# Patient Record
Sex: Female | Born: 1945 | Race: Black or African American | Hispanic: No | Marital: Single | Smoking: Current every day smoker
Health system: Southern US, Community
[De-identification: ages and names within clinical notes are randomized; demographics above are authoritative.]

## PROBLEM LIST (undated history)

## (undated) DIAGNOSIS — N200 Calculus of kidney: Secondary | ICD-10-CM

## (undated) DIAGNOSIS — I499 Cardiac arrhythmia, unspecified: Secondary | ICD-10-CM

## (undated) DIAGNOSIS — E785 Hyperlipidemia, unspecified: Secondary | ICD-10-CM

## (undated) DIAGNOSIS — I2510 Atherosclerotic heart disease of native coronary artery without angina pectoris: Secondary | ICD-10-CM

## (undated) DIAGNOSIS — R011 Cardiac murmur, unspecified: Secondary | ICD-10-CM

## (undated) DIAGNOSIS — M1990 Unspecified osteoarthritis, unspecified site: Secondary | ICD-10-CM

## (undated) DIAGNOSIS — J449 Chronic obstructive pulmonary disease, unspecified: Secondary | ICD-10-CM

## (undated) DIAGNOSIS — Z9289 Personal history of other medical treatment: Secondary | ICD-10-CM

## (undated) DIAGNOSIS — IMO0001 Reserved for inherently not codable concepts without codable children: Secondary | ICD-10-CM

## (undated) DIAGNOSIS — I10 Essential (primary) hypertension: Secondary | ICD-10-CM

## (undated) DIAGNOSIS — I219 Acute myocardial infarction, unspecified: Secondary | ICD-10-CM

## (undated) HISTORY — DX: Chronic obstructive pulmonary disease, unspecified: J44.9

## (undated) HISTORY — DX: Unspecified osteoarthritis, unspecified site: M19.90

## (undated) HISTORY — DX: Hyperlipidemia, unspecified: E78.5

## (undated) HISTORY — DX: Calculus of kidney: N20.0

## (undated) HISTORY — DX: Personal history of other medical treatment: Z92.89

## (undated) HISTORY — PX: BREAST BIOPSY: SHX20

## (undated) HISTORY — DX: Essential (primary) hypertension: I10

## (undated) HISTORY — DX: Atherosclerotic heart disease of native coronary artery without angina pectoris: I25.10

## (undated) HISTORY — DX: Cardiac arrhythmia, unspecified: I49.9

---

## 1999-05-11 ENCOUNTER — Emergency Department (HOSPITAL_COMMUNITY): Admission: EM | Admit: 1999-05-11 | Discharge: 1999-05-11 | Payer: Self-pay | Admitting: Emergency Medicine

## 1999-06-13 ENCOUNTER — Encounter: Payer: Self-pay | Admitting: Emergency Medicine

## 1999-06-13 ENCOUNTER — Emergency Department (HOSPITAL_COMMUNITY): Admission: EM | Admit: 1999-06-13 | Discharge: 1999-06-13 | Payer: Self-pay | Admitting: Emergency Medicine

## 2001-04-28 ENCOUNTER — Encounter: Payer: Self-pay | Admitting: Emergency Medicine

## 2001-04-28 ENCOUNTER — Inpatient Hospital Stay (HOSPITAL_COMMUNITY): Admission: EM | Admit: 2001-04-28 | Discharge: 2001-04-29 | Payer: Self-pay | Admitting: Emergency Medicine

## 2001-04-29 ENCOUNTER — Encounter: Payer: Self-pay | Admitting: Family Medicine

## 2001-05-25 ENCOUNTER — Encounter: Admission: RE | Admit: 2001-05-25 | Discharge: 2001-05-25 | Payer: Self-pay | Admitting: Family Medicine

## 2007-07-13 ENCOUNTER — Emergency Department (HOSPITAL_COMMUNITY): Admission: EM | Admit: 2007-07-13 | Discharge: 2007-07-13 | Payer: Self-pay | Admitting: Emergency Medicine

## 2010-04-21 ENCOUNTER — Emergency Department (HOSPITAL_COMMUNITY)
Admission: EM | Admit: 2010-04-21 | Discharge: 2010-04-21 | Payer: Self-pay | Source: Home / Self Care | Admitting: Emergency Medicine

## 2010-04-22 LAB — POCT I-STAT, CHEM 8
BUN: 14 mg/dL (ref 6–23)
Calcium, Ion: 1.16 mmol/L (ref 1.12–1.32)
Chloride: 107 mEq/L (ref 96–112)
Creatinine, Ser: 0.9 mg/dL (ref 0.4–1.2)
Glucose, Bld: 103 mg/dL — ABNORMAL HIGH (ref 70–99)
HCT: 49 % — ABNORMAL HIGH (ref 36.0–46.0)
Hemoglobin: 16.7 g/dL — ABNORMAL HIGH (ref 12.0–15.0)
Potassium: 4.1 mEq/L (ref 3.5–5.1)
Sodium: 142 mEq/L (ref 135–145)
TCO2: 26 mmol/L (ref 0–100)

## 2010-04-22 LAB — URINALYSIS, ROUTINE W REFLEX MICROSCOPIC
Bilirubin Urine: NEGATIVE
Hgb urine dipstick: NEGATIVE
Ketones, ur: NEGATIVE mg/dL
Nitrite: NEGATIVE
Protein, ur: NEGATIVE mg/dL
Specific Gravity, Urine: 1.025 (ref 1.005–1.030)
Urine Glucose, Fasting: NEGATIVE mg/dL
Urobilinogen, UA: 1 mg/dL (ref 0.0–1.0)
pH: 6 (ref 5.0–8.0)

## 2010-05-14 ENCOUNTER — Other Ambulatory Visit (HOSPITAL_COMMUNITY): Payer: Self-pay | Admitting: Internal Medicine

## 2010-05-14 DIAGNOSIS — Z1231 Encounter for screening mammogram for malignant neoplasm of breast: Secondary | ICD-10-CM

## 2010-05-26 ENCOUNTER — Ambulatory Visit (HOSPITAL_COMMUNITY): Payer: Medicaid Other | Attending: Internal Medicine
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
## 2011-02-10 ENCOUNTER — Other Ambulatory Visit: Payer: Self-pay

## 2011-02-10 DIAGNOSIS — I739 Peripheral vascular disease, unspecified: Secondary | ICD-10-CM

## 2011-02-19 ENCOUNTER — Encounter: Payer: Self-pay | Admitting: Gastroenterology

## 2011-02-19 ENCOUNTER — Encounter: Payer: Self-pay | Admitting: Surgery

## 2011-03-08 ENCOUNTER — Other Ambulatory Visit (HOSPITAL_COMMUNITY): Payer: Self-pay | Admitting: Nurse Practitioner

## 2011-03-08 ENCOUNTER — Ambulatory Visit (HOSPITAL_COMMUNITY)
Admission: RE | Admit: 2011-03-08 | Discharge: 2011-03-08 | Disposition: A | Discharge status: Home / Self Care | Payer: Medicaid Other | Source: Ambulatory Visit | Attending: Nurse Practitioner | Admitting: Nurse Practitioner

## 2011-03-08 DIAGNOSIS — R0602 Shortness of breath: Secondary | ICD-10-CM | POA: Insufficient documentation

## 2011-03-08 DIAGNOSIS — Z01818 Encounter for other preprocedural examination: Secondary | ICD-10-CM

## 2011-03-08 DIAGNOSIS — I10 Essential (primary) hypertension: Secondary | ICD-10-CM | POA: Insufficient documentation

## 2011-03-08 DIAGNOSIS — R079 Chest pain, unspecified: Secondary | ICD-10-CM | POA: Insufficient documentation

## 2011-03-10 ENCOUNTER — Encounter: Payer: Self-pay | Admitting: Gastroenterology

## 2011-03-10 ENCOUNTER — Ambulatory Visit (INDEPENDENT_AMBULATORY_CARE_PROVIDER_SITE_OTHER): Payer: Medicaid Other | Admitting: Gastroenterology

## 2011-03-10 VITALS — BP 150/82 | HR 108 | Ht 63.5 in | Wt 150.8 lb

## 2011-03-10 DIAGNOSIS — R195 Other fecal abnormalities: Secondary | ICD-10-CM

## 2011-03-10 DIAGNOSIS — I10 Essential (primary) hypertension: Secondary | ICD-10-CM

## 2011-03-10 DIAGNOSIS — I119 Hypertensive heart disease without heart failure: Secondary | ICD-10-CM | POA: Insufficient documentation

## 2011-03-10 MED ORDER — PEG-KCL-NACL-NASULF-NA ASC-C 100 G PO SOLR: 1.0000 | ORAL | Status: DC

## 2011-03-10 NOTE — Progress Notes (Signed)
HPI: This is a   very pleasant 65 year old woman who I am meeting for the first time.  She had fobt home testing, and was positive. She never sees blood in her stool.  No CRC in family.  No GI symptoms except mild, intemrittent consitpation.  Overall her weight is down, intentionally.  She has lost 40 pounds in 2 years.  Never had a colonoscopy.  We do not have tests from her PCP available at time of this visit (had blood tests done).  She stopped NSAIDs.  Was on alleve (pretty frequently up to about 6 months ago).    Review of systems: Pertinent positive and negative review of systems were noted in the above HPI section. Complete review of systems was performed and was otherwise normal.    Past Medical History  Diagnosis Date  . Arthritis   . Knee pain   . Hip pain   . Hypertension   . Kidney stones     Past Surgical History  Procedure Date  . Breast biopsy     Current Outpatient Prescriptions  Medication Sig Dispense Refill  . Acetaminophen (TYLENOL PO) Take by mouth.        [name] amLODipine (NORVASC) 10 MG tablet Take 10 mg by mouth daily.        . cloNIDine (CATAPRES) 0.1 MG tablet Take 0.1 mg by mouth 2 (two) times daily.        . Ibuprofen (ADVIL PO) Take by mouth.        . lovastatin (MEVACOR) 20 MG tablet Take 20 mg by mouth daily.        . Naproxen Sodium (ALEVE PO) Take by mouth.        . TRAMADOL HCL, BIPHASIC, PO Take by mouth.          Allergies as of 03/10/2011  . (No Known Allergies)    Family History  Problem Relation Age of Onset  . Diabetes Father   . Hypertension Father   . Breast cancer Mother     History   Social History  . Marital Status: Single    Spouse Name: N/A    Number of Children: 0  . Years of Education: N/A   Occupational History  . unemployed    Social History Main Topics  . Smoking status: Current Everyday Smoker -- 0.5 packs/day    Types: Cigarettes  . Smokeless tobacco: Not on file  . Alcohol Use: No  . Drug Use: Not  on file  . Sexually Active: Not on file   Other Topics Concern  . Not on file   Social History Narrative   2 cups of coffee daily       Physical Exam: BP 150/82  Pulse 108  Ht 5' 3.5" (1.613 m)  Wt 150 lb 12.8 oz (68.402 kg)  BMI 26.29 kg/m2  SpO2 96% Constitutional: generally well-appearing Psychiatric: alert and oriented x3 Eyes: extraocular movements intact Mouth: oral pharynx moist, no lesions Neck: supple no lymphadenopathy Cardiovascular: heart regular rate and rhythm Lungs: clear to auscultation bilaterally Abdomen: soft, nontender, nondistended, no obvious ascites, no peritoneal signs, normal bowel sounds Extremities: no lower extremity edema bilaterally Skin: no lesions on visible extremities    Assessment and plan: 65 y.o. female with  Hemoccult-positive stool  She has no overt GI bleeding and no specific GI symptoms. She has never had colon cancer screening with colonoscopy. We will proceed with colonoscopy at her soonest convenience. We will also work to get her stool  testing and blood work from her primary care office.  The risks, benefits, alternatives to the procedure were discussed with her and she agreed to proceed.

## 2011-03-10 NOTE — Patient Instructions (Addendum)
You have been given a separate informational sheet regarding your tobacco use, the importance of quitting and local resources to help you quit. You will be set up for a colonoscopy at [facility]. A copy of this information will be made available to Dr. Ruben Im.

## 2011-04-05 ENCOUNTER — Other Ambulatory Visit: Payer: Medicaid Other

## 2011-04-05 ENCOUNTER — Encounter: Payer: Medicaid Other | Admitting: Surgery

## 2011-04-07 ENCOUNTER — Telehealth: Payer: Self-pay | Admitting: Gastroenterology

## 2011-04-08 NOTE — Telephone Encounter (Signed)
Pt had questions regarding an appt with the vascular lab on 05/03/11 and her colon on 04/12/11.  She wanted to know if the vascular lab appt would interfere.  I advised her that the appt would not interfere.

## 2011-04-12 ENCOUNTER — Ambulatory Visit (AMBULATORY_SURGERY_CENTER): Payer: Medicaid Other | Admitting: Gastroenterology

## 2011-04-12 ENCOUNTER — Encounter: Payer: Self-pay | Admitting: Gastroenterology

## 2011-04-12 VITALS — BP 173/81 | HR 98 | Temp 98.6°F | Resp 17

## 2011-04-12 DIAGNOSIS — D126 Benign neoplasm of colon, unspecified: Secondary | ICD-10-CM

## 2011-04-12 DIAGNOSIS — K5730 Diverticulosis of large intestine without perforation or abscess without bleeding: Secondary | ICD-10-CM

## 2011-04-12 DIAGNOSIS — R195 Other fecal abnormalities: Secondary | ICD-10-CM

## 2011-04-12 MED ORDER — SODIUM CHLORIDE 0.9 % IV SOLN: 500.0000 mL | INTRAVENOUS | Status: DC

## 2011-04-12 NOTE — Patient Instructions (Signed)
Please read the handouts given to you by your recovery room nurse.    You will need to come back in 6 mos for another colonoscopy to be sure that all of the large polyp was removed.   Please, resume all of you routine medications today.  Your biopsy results will be sent to your home within 2 weeks.   If you have any questions or concerns, please call [phone]. Thank-you.
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
## 2011-04-12 NOTE — Op Note (Signed)
[facility] [address] [place], [place]  [address]  COLONOSCOPY PROCEDURE REPORT  PATIENT:  Brandy Copeland, Brandy Copeland  MR#:  [id number] BIRTHDATE:  May 10, 1945, 65 yrs. old  GENDER:  female ENDOSCOPIST:  Rachael Fee, MD REFERING:   Dr. Ruben Im PROCEDURE DATE:  04/12/2011 PROCEDURE:  Colonoscopy with snare polypectomy ASA CLASS:  Class II INDICATIONS:  FOBT positive stool MEDICATIONS:   Fentanyl 100 mcg IV, These medications were titrated to patient response per physician's verbal order, Versed 10 mg IV  DESCRIPTION OF PROCEDURE:   After the risks benefits and alternatives of the procedure were thoroughly explained, informed consent was obtained.  Digital rectal exam was performed and revealed no rectal masses.   The LB [id number]-[id number] endoscope was introduced through the anus and advanced to the cecum, which was identified by both the appendix and ileocecal valve, without limitations.  The quality of the prep was good..  The instrument was then slowly withdrawn as the colon was fully examined. <<PROCEDUREIMAGES>> FINDINGS:  Three polyps were found. One was 1.6cm across, villous, sessile, located in cecum. This was removed piecemeal with cold snare and snare cautery and sent to pathology (jar 1). The other two polyps were sessile, 2-58mm across, located in transverse and descending segments. These were removed with cold snare and sent to pathology (jar 2) (see image1, image5, image6, and image8). Mild diverticulosis was found in the sigmoid to descending colon segments (see image9).  This was otherwise a normal examination of the colon (see image10, image3, and image4).   Retroflexed views in the rectum revealed no abnormalities. COMPLICATIONS:  None ENDOSCOPIC IMPRESSION: 1) Three polyps; all were removed and all were sent to pathology. One was >1cm and removed in piecemeal fashion. 2) Mild diverticulosis in the sigmoid to descending colon segments 3) Otherwise  normal examination  RECOMMENDATIONS: 1) You will receive a letter within 1-2 weeks with the results of your biopsy as well as final recommendations. Please call my office if you have not received a letter after 3 weeks.  You will likely need a repeat colonoscopy in 6 months given the piecemeal resection.  ______________________________ Rachael Fee, MD  n. eSIGNED:   Rachael Fee at 04/12/2011 09:19 AM  Jesus Genera, [phone]

## 2011-04-13 ENCOUNTER — Telehealth: Payer: Self-pay | Admitting: *Deleted

## 2011-04-13 NOTE — Telephone Encounter (Signed)

## 2011-04-19 ENCOUNTER — Encounter: Payer: Self-pay | Admitting: Gastroenterology

## 2011-04-20 ENCOUNTER — Encounter: Payer: Self-pay | Admitting: *Deleted

## 2011-04-30 ENCOUNTER — Encounter: Payer: Self-pay | Admitting: Surgery

## 2011-05-03 ENCOUNTER — Encounter: Payer: Medicaid Other | Admitting: Surgery

## 2011-05-03 ENCOUNTER — Other Ambulatory Visit: Payer: Medicaid Other

## 2011-05-28 ENCOUNTER — Encounter: Payer: Self-pay | Admitting: Surgery

## 2011-05-31 ENCOUNTER — Encounter: Payer: Self-pay | Admitting: Surgery

## 2011-05-31 ENCOUNTER — Encounter (INDEPENDENT_AMBULATORY_CARE_PROVIDER_SITE_OTHER): Payer: Medicare Other | Admitting: *Deleted

## 2011-05-31 ENCOUNTER — Ambulatory Visit (INDEPENDENT_AMBULATORY_CARE_PROVIDER_SITE_OTHER): Payer: Medicare Other | Admitting: Surgery

## 2011-05-31 VITALS — BP 153/83 | HR 93 | Resp 16 | Ht 63.0 in | Wt 149.0 lb

## 2011-05-31 DIAGNOSIS — M79609 Pain in unspecified limb: Secondary | ICD-10-CM | POA: Insufficient documentation

## 2011-05-31 DIAGNOSIS — I739 Peripheral vascular disease, unspecified: Secondary | ICD-10-CM

## 2011-05-31 NOTE — Progress Notes (Signed)
[facility]   Patient name: Brandy Copeland MRN: [id number] DOB: 1945/06/17 Sex: female   Referred by: Dr. Cleophas Dunker  Reason for referral:  Chief Complaint  Patient presents with  . New Evaluation    Consult for right knee replacement-     HISTORY OF PRESENT ILLNESS: The patient comes in today for a vascular evaluation with an impending knee replacement surgery on the right. The patient complains of right knee and hip pain as well as left hip pain. She states is bothers her when she stands up and when she sits down. She does not endorse claudication like symptoms that occur with activity. I am asked to evaluate her preoperatively for knee replacement. The patient has a history of hypertension as well as an irregular heartbeat. She is not on anticoagulation.  Past Medical History  Diagnosis Date  . Arthritis   . Knee pain   . Hip pain   . Hypertension   . Kidney stones   . Irregular heart beat     Past Surgical History  Procedure Date  . Breast biopsy     History   Social History  . Marital Status: Single    Spouse Name: N/A    Number of Children: 0  . Years of Education: N/A   Occupational History  . unemployed    Social History Main Topics  . Smoking status: Current Everyday Smoker -- 0.5 packs/day    Types: Cigarettes  . Smokeless tobacco: Not on file  . Alcohol Use: No  . Drug Use: Not on file  . Sexually Active: Not on file   Other Topics Concern  . Not on file   Social History Narrative   2 cups of coffee daily    Family History  Problem Relation Age of Onset  . Diabetes Father     Amputation  . Hypertension Father   . Hyperlipidemia Father   . Heart disease Father     Heart Disease before age 72  . Breast cancer Mother   . Cancer Mother     Breast     Allergies as of 05/31/2011  . (No Known Allergies)    Current Outpatient Prescriptions on File Prior to Visit  Medication Sig Dispense Refill  . lovastatin  (MEVACOR) 20 MG tablet Take 20 mg by mouth daily.        . TRAMADOL HCL, BIPHASIC, PO Take by mouth.        [name] amLODipine (NORVASC) 10 MG tablet Take 10 mg by mouth daily.        . cloNIDine (CATAPRES) 0.1 MG tablet Take 0.1 mg by mouth 2 (two) times daily.           REVIEW OF SYSTEMS: All negative except what is mentioned in the history of present illness PHYSICAL EXAMINATION: General: The patient appears their stated age.  Vital signs are BP 153/83  Pulse 93  Resp 16  Ht 5\' 3"  (1.6 m)  Wt 149 lb (67.586 kg)  BMI 26.39 kg/m2  SpO2 97% HEENT:  No gross abnormalities Pulmonary: Respirations are non-labored Abdomen: Soft and non-tender , obese Musculoskeletal: There are no major deformities.   Neurologic: No focal weakness or paresthesias are detected, Skin: There are no ulcer or rashes noted. Psychiatric: The patient has normal affect. Cardiovascular: There is a regular rate and rhythm without significant murmur appreciated. Pedal pulses are nonpalpable. She has palpable femoral pulses.  Diagnostic Studies: Duplex ultrasound was performed today of both lower  extremities this shows the ABI 0.59 on the right and 0.57 on the left. 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    Assessment:  Bilateral lower extremity arterial insufficiency Plan: The patient is not symptomatic from her arterial insufficiency. I would recommend proceeding with her orthopedic procedures. If after her surgery has been completed, she still has pain or cramping in her calves with exercise I would consider further evaluation. I told the patient that she should contact me should the symptoms arise. Otherwise I will see her on a when necessary basis.     Jorge Ny, M.D. [facility] [place] Office: [phone] Pager:  [phone]

## 2011-06-14 NOTE — Procedures (Unsigned)
LOWER EXTREMITY ARTERIAL EVALUATION-SINGLE LEVEL  INDICATION:  Preop right knee replacement/claudication  HISTORY: Diabetes:  No Cardiac:  No Hypertension:  Yes Smoking:  Yes Previous Surgery:  RESTING SYSTOLIC PRESSURES: (ABI)                         RIGHT                LEFT Brachial: Anterior tibial: Posterior tibial: Peroneal: DOPPLER WAVEFORM ANALYSIS: Anterior tibial: Posterior tibial: Peroneal:  PREVIOUS ABI'S:  Date:  RIGHT:  LEFT:  DUPLEX:  Right:  Mixed plaque throughout with biphasic waveforms proximally to the distal SFA with increased velocity of 208 in the popliteal artery.  Damped monophasic waveform from the distal SFA on distally. Left:  No color flow or Doppler signal in the mid SFA with reconstitution on distally.  IMPRESSION: 1. 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 2. Occluded left mid superficial femoral artery with reconstitution at     distal superficial femoral artery. 3. Moderate reduction of ankle brachial indices bilaterally.  ___________________________________________ V. Charlena Cross, MD  SS/MEDQ  D:  05/31/2011  T:  05/31/2011  Job:  [id number]

## 2011-09-29 ENCOUNTER — Encounter: Payer: Self-pay | Admitting: Gastroenterology

## 2011-09-30 ENCOUNTER — Other Ambulatory Visit: Payer: Self-pay | Admitting: Internal Medicine

## 2011-09-30 DIAGNOSIS — Z1231 Encounter for screening mammogram for malignant neoplasm of breast: Secondary | ICD-10-CM

## 2011-10-08 ENCOUNTER — Ambulatory Visit: Payer: Medicare Other

## 2012-07-13 ENCOUNTER — Encounter: Payer: Self-pay | Admitting: Gastroenterology

## 2012-12-29 ENCOUNTER — Encounter (HOSPITAL_COMMUNITY): Payer: Self-pay | Admitting: Emergency Medicine

## 2012-12-29 ENCOUNTER — Emergency Department (HOSPITAL_COMMUNITY): Payer: Medicare Other

## 2012-12-29 ENCOUNTER — Inpatient Hospital Stay (HOSPITAL_COMMUNITY)
Admission: EM | Admit: 2012-12-29 | Discharge: 2013-01-04 | DRG: 190 | Disposition: A | Discharge status: Home / Self Care | Payer: Medicare Other | Attending: Family Medicine | Admitting: Family Medicine

## 2012-12-29 DIAGNOSIS — I2510 Atherosclerotic heart disease of native coronary artery without angina pectoris: Secondary | ICD-10-CM | POA: Diagnosis present

## 2012-12-29 DIAGNOSIS — I498 Other specified cardiac arrhythmias: Secondary | ICD-10-CM | POA: Diagnosis present

## 2012-12-29 DIAGNOSIS — E785 Hyperlipidemia, unspecified: Secondary | ICD-10-CM | POA: Diagnosis present

## 2012-12-29 DIAGNOSIS — J9600 Acute respiratory failure, unspecified whether with hypoxia or hypercapnia: Secondary | ICD-10-CM | POA: Diagnosis present

## 2012-12-29 DIAGNOSIS — I2489 Other forms of acute ischemic heart disease: Secondary | ICD-10-CM | POA: Diagnosis present

## 2012-12-29 DIAGNOSIS — I119 Hypertensive heart disease without heart failure: Secondary | ICD-10-CM | POA: Diagnosis present

## 2012-12-29 DIAGNOSIS — Z23 Encounter for immunization: Secondary | ICD-10-CM

## 2012-12-29 DIAGNOSIS — F17200 Nicotine dependence, unspecified, uncomplicated: Secondary | ICD-10-CM | POA: Diagnosis present

## 2012-12-29 DIAGNOSIS — I10 Essential (primary) hypertension: Secondary | ICD-10-CM

## 2012-12-29 DIAGNOSIS — M79609 Pain in unspecified limb: Secondary | ICD-10-CM

## 2012-12-29 DIAGNOSIS — I248 Other forms of acute ischemic heart disease: Secondary | ICD-10-CM | POA: Diagnosis present

## 2012-12-29 DIAGNOSIS — Z79899 Other long term (current) drug therapy: Secondary | ICD-10-CM

## 2012-12-29 DIAGNOSIS — R7309 Other abnormal glucose: Secondary | ICD-10-CM | POA: Diagnosis present

## 2012-12-29 DIAGNOSIS — R000 Tachycardia, unspecified: Secondary | ICD-10-CM

## 2012-12-29 DIAGNOSIS — I214 Non-ST elevation (NSTEMI) myocardial infarction: Secondary | ICD-10-CM | POA: Diagnosis present

## 2012-12-29 DIAGNOSIS — J441 Chronic obstructive pulmonary disease with (acute) exacerbation: Principal | ICD-10-CM | POA: Diagnosis present

## 2012-12-29 LAB — POCT I-STAT 3, ART BLOOD GAS (G3+)
O2 Saturation: 98 %
Patient temperature: 98.6
TCO2: 25 mmol/L (ref 0–100)
pCO2 arterial: 47 mmHg — ABNORMAL HIGH (ref 35.0–45.0)
pH, Arterial: 7.312 — ABNORMAL LOW (ref 7.350–7.450)
pO2, Arterial: 111 mmHg — ABNORMAL HIGH (ref 80.0–100.0)

## 2012-12-29 LAB — POCT I-STAT TROPONIN I: Troponin i, poc: 0.13 ng/mL (ref 0.00–0.08)

## 2012-12-29 LAB — BASIC METABOLIC PANEL
CO2: 22 mEq/L (ref 19–32)
Calcium: 9.2 mg/dL (ref 8.4–10.5)
Creatinine, Ser: 0.62 mg/dL (ref 0.50–1.10)
Glucose, Bld: 186 mg/dL — ABNORMAL HIGH (ref 70–99)

## 2012-12-29 LAB — CBC WITH DIFFERENTIAL/PLATELET
Eosinophils Relative: 3 % (ref 0–5)
HCT: 46.2 % — ABNORMAL HIGH (ref 36.0–46.0)
Lymphocytes Relative: 18 % (ref 12–46)
Lymphs Abs: 2.2 10*3/uL (ref 0.7–4.0)
MCH: 33.7 pg (ref 26.0–34.0)
MCHC: 35.3 g/dL (ref 30.0–36.0)
MCV: 95.5 fL (ref 78.0–100.0)
Monocytes Absolute: 0.6 10*3/uL (ref 0.1–1.0)
Platelets: 386 10*3/uL (ref 150–400)
RBC: 4.84 MIL/uL (ref 3.87–5.11)
WBC: 12.3 10*3/uL — ABNORMAL HIGH (ref 4.0–10.5)

## 2012-12-29 LAB — PRO B NATRIURETIC PEPTIDE: Pro B Natriuretic peptide (BNP): 441.9 pg/mL — ABNORMAL HIGH (ref 0–125)

## 2012-12-29 MED ORDER — NITROGLYCERIN 0.4 MG SL SUBL: 0.4000 mg | SUBLINGUAL_TABLET | SUBLINGUAL | Status: DC | PRN

## 2012-12-29 MED ORDER — ALBUTEROL (5 MG/ML) CONTINUOUS INHALATION SOLN
INHALATION_SOLUTION | RESPIRATORY_TRACT | Status: AC
  Administered 2012-12-29: 15 mg/h via RESPIRATORY_TRACT
  Filled 2012-12-29: qty 20

## 2012-12-29 MED ORDER — ALBUTEROL (5 MG/ML) CONTINUOUS INHALATION SOLN
15.0000 mg/h | INHALATION_SOLUTION | Freq: Once | RESPIRATORY_TRACT | Status: AC
  Administered 2012-12-29: 15 mg/h via RESPIRATORY_TRACT

## 2012-12-29 MED ORDER — MORPHINE SULFATE 4 MG/ML IJ SOLN
4.0000 mg | Freq: Once | INTRAMUSCULAR | Status: AC
  Administered 2012-12-29: 4 mg via INTRAVENOUS
  Filled 2012-12-29: qty 1

## 2012-12-29 MED ORDER — NITROGLYCERIN 2 % TD OINT
1.0000 [in_us] | TOPICAL_OINTMENT | Freq: Four times a day (QID) | TRANSDERMAL | Status: DC
  Administered 2012-12-29: 1 [in_us] via TOPICAL
  Filled 2012-12-29: qty 1

## 2012-12-29 MED ORDER — LEVOFLOXACIN IN D5W 500 MG/100ML IV SOLN
500.0000 mg | INTRAVENOUS | Status: DC
  Administered 2012-12-29: 500 mg via INTRAVENOUS
  Filled 2012-12-29 (×2): qty 100

## 2012-12-29 MED ORDER — ASPIRIN EC 325 MG PO TBEC
325.0000 mg | DELAYED_RELEASE_TABLET | Freq: Every day | ORAL | Status: DC
  Administered 2012-12-29 – 2013-01-04 (×6): 325 mg via ORAL
  Filled 2012-12-29 (×6): qty 1

## 2012-12-29 NOTE — ED Notes (Signed)
Pt to ED via [facility] for evaluation of difficulty breathing.  EMS found pt in tripod position SpO2 < 90% upon arrival, wheezing noted throughout.  EMS administered 125mg  Solumedrol, 1mg  Atrovent, 10mg  Albuterol.  Upon arrival to ED pt states that her chest feels tight, tachycardic on the monitor- 142bpm.  Respiratory and MD at bedside.

## 2012-12-29 NOTE — ED Provider Notes (Signed)
CSN: [id number]     Arrival date & time 12/29/12  1819 History   First MD Initiated Contact with Patient 12/29/12 1827     Chief Complaint  Patient presents with  . Respiratory Distress   (Consider location/radiation/quality/duration/timing/severity/associated sxs/prior Treatment) Patient is a 67 y.o. female presenting with shortness of breath. The history is provided by the patient and the EMS personnel. No language interpreter was used.  Shortness of Breath Severity:  Severe Onset quality:  Gradual Duration:  2 days Timing:  Constant Progression:  Worsening Chronicity:  Recurrent Context: URI   Relieved by:  Oxygen Worsened by:  Exertion (lying down) Ineffective treatments:  Inhaler Associated symptoms: cough, diaphoresis, sputum production and wheezing   Associated symptoms: no abdominal pain, no fever, no hemoptysis and no vomiting   Cough:    Cough characteristics:  Non-productive Risk factors: tobacco use   Risk factors: no hx of cancer, no hx of PE/DVT, no prolonged immobilization and no recent surgery     Past Medical History  Diagnosis Date  . Arthritis   . Knee pain   . Hip pain   . Hypertension   . Kidney stones   . Irregular heart beat    Past Surgical History  Procedure Laterality Date  . Breast biopsy     Family History  Problem Relation Age of Onset  . Diabetes Father     Amputation  . Hypertension Father   . Hyperlipidemia Father   . Heart disease Father     Heart Disease before age 24  . Breast cancer Mother   . Cancer Mother     Breast    History  Substance Use Topics  . Smoking status: Current Every Day Smoker -- 0.50 packs/day    Types: Cigarettes  . Smokeless tobacco: Not on file  . Alcohol Use: No   OB History   Grav Para Term Preterm Abortions TAB SAB Ect Mult Living                 Review of Systems  Constitutional: Positive for diaphoresis. Negative for fever.  Respiratory: Positive for cough, sputum production, chest  tightness, shortness of breath and wheezing. Negative for hemoptysis.   Cardiovascular: Negative for palpitations and leg swelling.  Gastrointestinal: Negative for nausea, vomiting and abdominal pain.  Musculoskeletal: Negative for back pain.  All other systems reviewed and are negative.    Allergies  Review of patient's allergies indicates no known allergies.  Home Medications   Current Outpatient Rx  Name  Route  Sig  Dispense  Refill  . amLODipine (NORVASC) 10 MG tablet   Oral   Take 10 mg by mouth daily.           . cloNIDine (CATAPRES) 0.1 MG tablet   Oral   Take 0.1 mg by mouth 2 (two) times daily.           [name] lovastatin (MEVACOR) 20 MG tablet   Oral   Take 20 mg by mouth daily.           . Olmesartan-Amlodipine-HCTZ (TRIBENZOR) 40-5-12.5 MG TABS   Oral   Take by mouth.         . TRAMADOL HCL, BIPHASIC, PO   Oral   Take by mouth.            BP 210/114  Pulse 142  Temp(Src) 100.3 F (37.9 C) (Rectal)  Resp 22  SpO2 98% Physical Exam  Nursing note and vitals reviewed. Constitutional: She is  oriented to person, place, and time. She appears well-developed and well-nourished. She appears distressed.  HENT:  Head: Normocephalic.  Eyes: Right eye exhibits no discharge. Left eye exhibits no discharge.  Neck: Neck supple. No JVD present. No tracheal deviation present.  Cardiovascular: Regular rhythm, normal heart sounds and intact distal pulses.  Tachycardia present.   Pulmonary/Chest: No stridor. Tachypnea noted. She is in respiratory distress. She has wheezes (diffuse). She has rales.  Abdominal: Soft. Bowel sounds are normal. She exhibits no distension. There is no tenderness.  Musculoskeletal: She exhibits no edema and no tenderness.  Neurological: She is alert and oriented to person, place, and time.  Skin: Skin is warm. She is diaphoretic.    ED Course  Procedures (including critical care time) Labs Review Labs Reviewed  CBC WITH DIFFERENTIAL  - Abnormal; Notable for the following:    WBC 12.3 (*)    Hemoglobin 16.3 (*)    HCT 46.2 (*)    Neutro Abs 9.1 (*)    All other components within normal limits  BASIC METABOLIC PANEL  PRO B NATRIURETIC PEPTIDE  URINALYSIS, ROUTINE W REFLEX MICROSCOPIC   Imaging Review Dg Chest Portable 1 View  12/29/2012   CLINICAL DATA:  Respiratory distress  EXAM: PORTABLE CHEST - 1 VIEW  COMPARISON:  [id number]  FINDINGS: The heart size and mediastinal contours are within normal limits. The lungs are hyperexpanded but clear. The visualized skeletal structures are demineralized but otherwise unremarkable.  IMPRESSION: No acute cardiopulmonary disease   Electronically Signed   By: Amie Portland   On: 12/29/2012 19:04    Date: 12/30/2012  Rate: 112  Rhythm: sinus tachycardia  QRS Axis: normal  Intervals: normal  ST/T Wave abnormalities: nonspecific T wave changes  Conduction Disutrbances:none  Narrative Interpretation: sinus tachycardia  Old EKG Reviewed: none available    MDM  No diagnosis found.  67 y/o female with history of HTN, COPD presenting with respiratory distress. Reports 2 days on dyspnea, cough productive of white sputum. EMS report sats 90% with wheezing. Given 10 mg albuterol, 125 mg solumedrol, 1 mg atrovent prior to arrival. Rectal temp 100.3. Initially hypertensive but BP 130s systolic after Bipap. EKG: sinus tachycardia, no ischemic changes. CXR no acute CP disease. Labs remarkable for mild leukocytosis, Troponin 0.33, BNP 441. ABG 7.31/47/111/23.8. Lactic acid 2.11. ASA given. Placed on bipap with improved respiratory effort. Denies risk factors for PE.  Levofloxacin given after blood cultures collected for possible HCAP given borderline temperature, sputum, and leukocytosis. Cardiology consulted for NSTEMI. Plan for admission to medicine for COPD exacerbation, NSTEMI.   Labs and imaging reviewed in my medical decision making. Patient discussed with my attending, Dr. Jodi Mourning.      Abagail Kitchens, MD 12/30/12 1237  Medical screening examination/treatment/procedure(s) were conducted as a shared visit with non-physician practitioner(s) or resident  and myself.  I personally evaluated the patient during the encounter and agree with the findings and plan unless otherwise indicated.    Asthma/ COPD/ HTN hx, smoker presents with gradually worsening breathing since yesterday.  Resp distress on arrival, O2 in 80s on arrival.  Mild chest tightness.  [place] then BIpap initiated.  Pt denies cardiac or HF hx.  No wt gain or leg swelling.  Pt has rales and exp wheeze on exam. Cont neb, bipap, cardiac eval.  EKG no acute findings, tachycardia, reviewed with resident.  CHF vs COPD vs Pneumonia.  CXR reviewed.   Patient denies blood clot history, active cancer, recent major trauma or surgery,  unilateral leg swelling/ pain, recent long travel, hemoptysis. Multiple rechecks, pt improved.  Spoke with intensivist, feels pt appropriate for Step down. Paged hospitalist.    CRITICAL CARE Performed by: Enid Skeens   Total critical care time: 40 min  Critical care time was exclusive of separately billable procedures and treating other patients.  Critical care was necessary to treat or prevent imminent or life-threatening deterioration.  Critical care was time spent personally by me on the following activities: development of treatment plan with patient and/or surrogate as well as nursing, discussions with consultants, evaluation of patient's response to treatment, examination of patient, obtaining history from patient or surrogate, ordering and performing treatments and interventions, ordering and review of laboratory studies, ordering and review of radiographic studies, pulse oximetry and re-evaluation of patient's condition.  Emergency Ultrasound: Limited Thoracic Performed and interpreted by Dr Jodi Mourning Longitudinal view of anterior left and right lung fields in real-time with linear  probe. Indication: dyspnea Findings: good lung sliding bilateral mild B lines Interpretation: no evidence of pneumothorax. Images electronically archived.     EMERGENCY DEPARTMENT [place] CARDIAC EXAM "Study: Limited Ultrasound of the heart and pericardium"  INDICATIONS:Tachycardia and Dyspnea Multiple views of the heart and pericardium were obtained in real-time with a multi-frequency probe.  PERFORMED ZO:[id number]  IMAGES ARCHIVED?: Yes  FINDINGS: No pericardial effusion and Hyperdynamic contractility  LIMITATIONS:  Body habitus  VIEWS USED: Subcostal 4 chamber, Parasternal long axis, Parasternal short axis, Apical 4 chamber  and Inferior Vena Cava  INTERPRETATION: Cardiac activity present, Pericardial effusioin absent, Cardiac tamponade absent and Volume status normal   Acute dyspnea, Acute COPD exacerbation, NSTEMI   Enid Skeens, MD 01/03/13 1842

## 2012-12-30 ENCOUNTER — Encounter (HOSPITAL_COMMUNITY): Payer: Self-pay | Admitting: *Deleted

## 2012-12-30 DIAGNOSIS — I498 Other specified cardiac arrhythmias: Secondary | ICD-10-CM

## 2012-12-30 DIAGNOSIS — R7989 Other specified abnormal findings of blood chemistry: Secondary | ICD-10-CM

## 2012-12-30 DIAGNOSIS — R0602 Shortness of breath: Secondary | ICD-10-CM

## 2012-12-30 DIAGNOSIS — I214 Non-ST elevation (NSTEMI) myocardial infarction: Secondary | ICD-10-CM | POA: Insufficient documentation

## 2012-12-30 DIAGNOSIS — J441 Chronic obstructive pulmonary disease with (acute) exacerbation: Principal | ICD-10-CM

## 2012-12-30 DIAGNOSIS — F17200 Nicotine dependence, unspecified, uncomplicated: Secondary | ICD-10-CM

## 2012-12-30 DIAGNOSIS — E785 Hyperlipidemia, unspecified: Secondary | ICD-10-CM | POA: Diagnosis present

## 2012-12-30 DIAGNOSIS — I10 Essential (primary) hypertension: Secondary | ICD-10-CM

## 2012-12-30 DIAGNOSIS — R000 Tachycardia, unspecified: Secondary | ICD-10-CM | POA: Diagnosis present

## 2012-12-30 LAB — BASIC METABOLIC PANEL
CO2: 21 mEq/L (ref 19–32)
Calcium: 8.8 mg/dL (ref 8.4–10.5)
Chloride: 100 mEq/L (ref 96–112)
Creatinine, Ser: 0.56 mg/dL (ref 0.50–1.10)
GFR calc Af Amer: >90 mL/min (ref >90)
Glucose, Bld: 194 mg/dL — ABNORMAL HIGH (ref 70–99)
Potassium: 3.4 mEq/L — ABNORMAL LOW (ref 3.5–5.1)
Sodium: 137 mEq/L (ref 135–145)

## 2012-12-30 LAB — HEPARIN LEVEL (UNFRACTIONATED): Heparin Unfractionated: 0.18 IU/mL — ABNORMAL LOW (ref 0.30–0.70)

## 2012-12-30 LAB — CBC
Hemoglobin: 15 g/dL (ref 12.0–15.0)
MCH: 33.3 pg (ref 26.0–34.0)
Platelets: 341 10*3/uL (ref 150–400)
Platelets: 328 10*3/uL (ref 150–400)
RBC: 4.32 MIL/uL (ref 3.87–5.11)
RBC: 4.47 MIL/uL (ref 3.87–5.11)
RDW: 15 % (ref 11.5–15.5)
RDW: 15.1 % (ref 11.5–15.5)
WBC: 6.8 10*3/uL (ref 4.0–10.5)
WBC: 8.4 10*3/uL (ref 4.0–10.5)

## 2012-12-30 LAB — URINALYSIS, ROUTINE W REFLEX MICROSCOPIC
Bilirubin Urine: NEGATIVE
Glucose, UA: 100 mg/dL — AB
Ketones, ur: 15 mg/dL — AB
Leukocytes, UA: NEGATIVE
Specific Gravity, Urine: 1.016 (ref 1.005–1.030)
Urobilinogen, UA: 0.2 mg/dL (ref 0.0–1.0)
pH: 5.5 (ref 5.0–8.0)

## 2012-12-30 LAB — TROPONIN I
Troponin I: 1.1 ng/mL (ref <0.30)
Troponin I: 0.66 ng/mL (ref <0.30)

## 2012-12-30 LAB — URINE MICROSCOPIC-ADD ON

## 2012-12-30 LAB — HEMOGLOBIN A1C: Hgb A1c MFr Bld: 5.7 % — ABNORMAL HIGH (ref <5.7)

## 2012-12-30 LAB — MRSA PCR SCREENING: MRSA by PCR: NEGATIVE

## 2012-12-30 MED ORDER — SIMVASTATIN 10 MG PO TABS
10.0000 mg | ORAL_TABLET | Freq: Every day | ORAL | Status: DC
  Administered 2012-12-30 – 2013-01-03 (×5): 10 mg via ORAL
  Filled 2012-12-30 (×6): qty 1

## 2012-12-30 MED ORDER — PNEUMOCOCCAL VAC POLYVALENT 25 MCG/0.5ML IJ INJ
0.5000 mL | INJECTION | INTRAMUSCULAR | Status: AC
  Administered 2012-12-31: 0.5 mL via INTRAMUSCULAR
  Filled 2012-12-30: qty 0.5

## 2012-12-30 MED ORDER — OXYCODONE HCL 5 MG PO TABS: 5.0000 mg | ORAL_TABLET | ORAL | Status: DC | PRN

## 2012-12-30 MED ORDER — ONDANSETRON HCL 4 MG PO TABS: 4.0000 mg | ORAL_TABLET | Freq: Four times a day (QID) | ORAL | Status: DC | PRN

## 2012-12-30 MED ORDER — HYDROMORPHONE HCL PF 1 MG/ML IJ SOLN: 0.5000 mg | INTRAMUSCULAR | Status: DC | PRN

## 2012-12-30 MED ORDER — ENOXAPARIN SODIUM 40 MG/0.4ML ~~LOC~~ SOLN: 40.0000 mg | SUBCUTANEOUS | Status: DC

## 2012-12-30 MED ORDER — HEPARIN BOLUS VIA INFUSION
2000.0000 [IU] | Freq: Once | INTRAVENOUS | Status: AC
  Administered 2012-12-30: 2000 [IU] via INTRAVENOUS
  Filled 2012-12-30: qty 2000

## 2012-12-30 MED ORDER — CLONIDINE HCL 0.1 MG PO TABS
0.1000 mg | ORAL_TABLET | Freq: Two times a day (BID) | ORAL | Status: DC
  Administered 2012-12-30 – 2013-01-04 (×11): 0.1 mg via ORAL
  Filled 2012-12-30 (×14): qty 1

## 2012-12-30 MED ORDER — ACETAMINOPHEN 650 MG RE SUPP: 650.0000 mg | Freq: Four times a day (QID) | RECTAL | Status: DC | PRN

## 2012-12-30 MED ORDER — INFLUENZA VAC SPLIT QUAD 0.5 ML IM SUSP
0.5000 mL | INTRAMUSCULAR | Status: AC
  Administered 2012-12-31: 0.5 mL via INTRAMUSCULAR
  Filled 2012-12-30: qty 0.5

## 2012-12-30 MED ORDER — ALBUTEROL SULFATE (5 MG/ML) 0.5% IN NEBU
2.5000 mg | INHALATION_SOLUTION | Freq: Four times a day (QID) | RESPIRATORY_TRACT | Status: DC
  Administered 2012-12-30 – 2013-01-01 (×9): 2.5 mg via RESPIRATORY_TRACT
  Filled 2012-12-30 (×9): qty 0.5

## 2012-12-30 MED ORDER — NITROGLYCERIN 2 % TD OINT
1.0000 [in_us] | TOPICAL_OINTMENT | Freq: Four times a day (QID) | TRANSDERMAL | Status: DC
  Administered 2012-12-29 – 2012-12-31 (×6): 1 [in_us] via TOPICAL
  Filled 2012-12-30: qty 30

## 2012-12-30 MED ORDER — AMLODIPINE BESYLATE 10 MG PO TABS
10.0000 mg | ORAL_TABLET | Freq: Every day | ORAL | Status: DC
  Administered 2012-12-30 – 2013-01-01 (×3): 10 mg via ORAL
  Filled 2012-12-30 (×5): qty 1

## 2012-12-30 MED ORDER — ZOLPIDEM TARTRATE 5 MG PO TABS: 5.0000 mg | ORAL_TABLET | Freq: Every evening | ORAL | Status: DC | PRN

## 2012-12-30 MED ORDER — SODIUM CHLORIDE 0.9 % IV SOLN: INTRAVENOUS | Status: AC

## 2012-12-30 MED ORDER — HEPARIN (PORCINE) IN NACL 100-0.45 UNIT/ML-% IJ SOLN
900.0000 [IU]/h | INTRAMUSCULAR | Status: DC
  Administered 2012-12-30: 900 [IU]/h via INTRAVENOUS
  Filled 2012-12-30 (×2): qty 250

## 2012-12-30 MED ORDER — LEVOFLOXACIN IN D5W 500 MG/100ML IV SOLN: 500.0000 mg | INTRAVENOUS | Status: DC

## 2012-12-30 MED ORDER — LEVOFLOXACIN 500 MG PO TABS
500.0000 mg | ORAL_TABLET | Freq: Every day | ORAL | Status: DC
  Administered 2012-12-31 – 2013-01-04 (×4): 500 mg via ORAL
  Filled 2012-12-30 (×5): qty 1

## 2012-12-30 MED ORDER — ACETAMINOPHEN 325 MG PO TABS: 650.0000 mg | ORAL_TABLET | Freq: Four times a day (QID) | ORAL | Status: DC | PRN

## 2012-12-30 MED ORDER — PREDNISONE 50 MG PO TABS
50.0000 mg | ORAL_TABLET | Freq: Every day | ORAL | Status: DC
  Administered 2012-12-30 – 2012-12-31 (×2): 50 mg via ORAL
  Filled 2012-12-30 (×3): qty 1

## 2012-12-30 MED ORDER — NICOTINE 14 MG/24HR TD PT24
14.0000 mg | MEDICATED_PATCH | Freq: Every day | TRANSDERMAL | Status: DC
  Administered 2012-12-30 – 2013-01-04 (×5): 14 mg via TRANSDERMAL
  Filled 2012-12-30 (×6): qty 1

## 2012-12-30 MED ORDER — ALUM & MAG HYDROXIDE-SIMETH 200-200-20 MG/5ML PO SUSP: 30.0000 mL | Freq: Four times a day (QID) | ORAL | Status: DC | PRN

## 2012-12-30 MED ORDER — SODIUM CHLORIDE 0.9 % IV SOLN
INTRAVENOUS | Status: DC
  Administered 2012-12-30: 1000 mL via INTRAVENOUS

## 2012-12-30 MED ORDER — HEPARIN (PORCINE) IN NACL 100-0.45 UNIT/ML-% IJ SOLN
1400.0000 [IU]/h | INTRAMUSCULAR | Status: DC
  Administered 2012-12-30 (×2): 1100 [IU]/h via INTRAVENOUS
  Filled 2012-12-30 (×2): qty 250

## 2012-12-30 MED ORDER — ONDANSETRON HCL 4 MG/2ML IJ SOLN: 4.0000 mg | Freq: Four times a day (QID) | INTRAMUSCULAR | Status: DC | PRN

## 2012-12-30 MED ORDER — IPRATROPIUM BROMIDE 0.02 % IN SOLN
0.5000 mg | Freq: Four times a day (QID) | RESPIRATORY_TRACT | Status: DC
  Administered 2012-12-30 – 2013-01-01 (×9): 0.5 mg via RESPIRATORY_TRACT
  Filled 2012-12-30 (×9): qty 2.5

## 2012-12-30 MED ORDER — HYDRALAZINE HCL 20 MG/ML IJ SOLN: 10.0000 mg | Freq: Four times a day (QID) | INTRAMUSCULAR | Status: DC | PRN

## 2012-12-30 MED ORDER — HEPARIN BOLUS VIA INFUSION
4000.0000 [IU] | Freq: Once | INTRAVENOUS | Status: AC
  Administered 2012-12-30: 4000 [IU] via INTRAVENOUS
  Filled 2012-12-30: qty 4000

## 2012-12-30 NOTE — Progress Notes (Signed)
Patient ID: Brandy Copeland, female   DOB: 1945/08/28, 67 y.o.   MRN: [id number] [facility] PROGRESS NOTE  Brandy Copeland NWG:[id number] DOB: [birth year] DOA: 12/29/2012 PCP: Cleopatra Cedar, NP  Assessment/Plan:   COPD exacerbation: Breathing much improved this morning.  Will need nebs, steroids, continue antibiotics.  NSTEMI: cardiology following.  Continue heparin.  ECHO pending. Possible cath on Monday   Hypertension: controled   Tobacco use disorder: smoking cessation counseling today. Hyperglycemia: check a1c  Code Status: full Family Communication: spoke with patient at bedside Disposition Plan: may transfer to tele this afternoon if resp status remains stable.   Consultants:  cardiology  Procedures:  none  Antibiotics:  levoquin started 9/26 for COPD exacerbation   HPI/Subjective: 67 year old woman with history of COPD, CAD, HTN presented 9/26 with shortness of breath and fever.  Now treating for COPD exacerbation and NSTEMI.  BiPAP overnight, and now comfortable on room air.  Reports no shortness of breath.  No chest pain.  Objective: Filed Vitals:   12/30/12 1149  BP:   Pulse:   Temp: 98.9 F (37.2 C)  Resp:     Intake/Output Summary (Last 24 hours) at 12/30/12 1205 Last data filed at 12/30/12 0800  Gross per 24 hour  Intake    411 ml  Output      0 ml  Net    411 ml   Filed Weights   12/30/12 0050  Weight: 70.1 kg (154 lb 8.7 oz)    Exam:   General:  Alret, oriented, no distress  Cardiovascular: rrr no mrg  Respiratory: diffuse wheezes and rhonchi, fair air movement, no increase in work of breathing.  Abdomen: soft, non tender, non distended, bs normal  Musculoskeletal: no edema  Data Reviewed: Basic Metabolic Panel:  Recent Labs Lab 12/29/12 1834 12/30/12 0419  NA 141 137  K 3.5 3.4*  CL 101 100  CO2 22 21  GLUCOSE 186* 194*  BUN 10 13  CREATININE 0.62 0.56  CALCIUM 9.2 8.8   Liver Function Tests: No results found  for this basename: AST, ALT, ALKPHOS, BILITOT, PROT, ALBUMIN,  in the last 168 hours No results found for this basename: LIPASE, AMYLASE,  in the last 168 hours No results found for this basename: AMMONIA,  in the last 168 hours CBC:  Recent Labs Lab 12/29/12 1834 12/30/12 0419 12/30/12 0955  WBC 12.3* 6.8 8.4  NEUTROABS 9.1*  --   --   HGB 16.3* 14.4 15.0  HCT 46.2* 41.1 42.4  MCV 95.5 95.1 94.9  PLT 386 341 328   Cardiac Enzymes:  Recent Labs Lab 12/29/12 1834 12/30/12 0200 12/30/12 0820  TROPONINI 0.33* 1.10* 1.07*   BNP (last 3 results)  Recent Labs  12/29/12 1834  PROBNP 441.9*   CBG: No results found for this basename: GLUCAP,  in the last 168 hours  Recent Results (from the past 240 hour(s))  MRSA PCR SCREENING     Status: None   Collection Time    12/30/12  1:15 AM      Result Value Range Status   MRSA by PCR NEGATIVE  NEGATIVE Final   Comment:            The GeneXpert MRSA Assay (FDA     approved for NASAL specimens     only), is one component of a     comprehensive MRSA colonization     surveillance program. It is not     intended to diagnose MRSA  infection nor to guide or     monitor treatment for     MRSA infections.     Studies: Dg Chest Portable 1 View  12/29/2012   CLINICAL DATA:  Respiratory distress  EXAM: PORTABLE CHEST - 1 VIEW  COMPARISON:  [id number]  FINDINGS: The heart size and mediastinal contours are within normal limits. The lungs are hyperexpanded but clear. The visualized skeletal structures are demineralized but otherwise unremarkable.  IMPRESSION: No acute cardiopulmonary disease   Electronically Signed   By: Amie Portland   On: 12/29/2012 19:04    Scheduled Meds: . sodium chloride   Intravenous STAT  . amLODipine  10 mg Oral Daily  . aspirin EC  325 mg Oral Daily  . cloNIDine  0.1 mg Oral BID  . [START ON 12/31/2012] influenza vac split quadrivalent PF  0.5 mL Intramuscular Tomorrow-1000  . levofloxacin (LEVAQUIN) IV  500  mg Intravenous Q24H  . [START ON 12/31/2012] levofloxacin (LEVAQUIN) IV  500 mg Intravenous Q24H  . nicotine  14 mg Transdermal Daily  . nitroGLYCERIN  1 inch Topical Q6H  . [START ON 12/31/2012] pneumococcal 23 valent vaccine  0.5 mL Intramuscular Tomorrow-1000  . simvastatin  10 mg Oral q1800   Continuous Infusions: . sodium chloride 75 mL/hr at 12/30/12 0800  . heparin 900 Units/hr (12/30/12 0800)    Principal Problem:   COPD exacerbation Active Problems:   Hypertensive heart disease   Tobacco use disorder   Elevated troponin level   Sinus tachycardia   Hyperlipidemia    Time spent:   [facility]  [facility] Pager [phone] If 7PM-7AM, please contact night-coverage at [email or web address], password [facility] 12/30/2012, 12:05 PM  LOS: 1 day

## 2012-12-30 NOTE — Consult Note (Signed)
Reason for Consult: elevated troponin Referring Physician: Dr. Lesia Sago is an 67 y.o. female.  HPI: Ms. Brandy Copeland is a 67 yo woman with PMH of COPD and CAD who presents to the ER with shortness of breath and dsypnea on exerition over the last 2-3 days. She tells me she's had a productive cough for several days, some sputum production but has no chest pain nor has she ever had chest pain. In the ER she was placed on bipap given hypoxia and hypoxemia. She had a fever, some chills and IV levofloxacin initiated. Troponin initially 0.3 then up to 1.0. Cardiology consulted for elevated troponin. She tells me she has PND at times, long-time tobacco user, no syncope, no chest pain. She has significant limitations in walking from SOB. She has some family history of CAD on her paternal side.  Heparin initiated by hospitalist team.   Past Medical History  Diagnosis Date  . Arthritis   . Knee pain   . Hip pain   . Hypertension   . Kidney stones   . Irregular heart beat     Past Surgical History  Procedure Laterality Date  . Breast biopsy      Family History  Problem Relation Age of Onset  . Diabetes Father     Amputation  . Hypertension Father   . Hyperlipidemia Father   . Heart disease Father     Heart Disease before age 67  . Breast cancer Mother   . Cancer Mother     Breast     Social History:  reports that she has been smoking Cigarettes.  She has been smoking about 0.50 packs per day. She does not have any smokeless tobacco history on file. She reports that she does not drink alcohol. Her drug history is not on file.  Allergies: No Known Allergies  Medications:  I have reviewed the patient's current medications. Prior to Admission:  Prescriptions prior to admission  Medication Sig Dispense Refill  . amLODipine (NORVASC) 10 MG tablet Take 10 mg by mouth daily.        . cloNIDine (CATAPRES) 0.1 MG tablet Take 0.1 mg by mouth 2 (two) times daily.        [name]  lovastatin (MEVACOR) 20 MG tablet Take 20 mg by mouth daily.        . Olmesartan-Amlodipine-HCTZ (TRIBENZOR) 40-5-12.5 MG TABS Take by mouth.      . TRAMADOL HCL, BIPHASIC, PO Take by mouth.         Scheduled: . sodium chloride   Intravenous STAT  . amLODipine  10 mg Oral Daily  . aspirin EC  325 mg Oral Daily  . cloNIDine  0.1 mg Oral BID  . [START ON 12/31/2012] influenza vac split quadrivalent PF  0.5 mL Intramuscular Tomorrow-1000  . levofloxacin (LEVAQUIN) IV  500 mg Intravenous Q24H  . [START ON 12/31/2012] levofloxacin (LEVAQUIN) IV  500 mg Intravenous Q24H  . nicotine  14 mg Transdermal Daily  . nitroGLYCERIN  1 inch Topical Q6H  . nitroGLYCERIN  1 inch Topical Q6H  . [START ON 12/31/2012] pneumococcal 23 valent vaccine  0.5 mL Intramuscular Tomorrow-1000  . simvastatin  10 mg Oral q1800    Results for orders placed during the hospital encounter of 12/29/12 (from the past 48 hour(s))  CBC WITH DIFFERENTIAL     Status: Abnormal   Collection Time    12/29/12  6:34 PM      Result Value Range   WBC 12.3 (*)  4.0 - 10.5 K/uL   RBC 4.84  3.87 - 5.11 MIL/uL   Hemoglobin 16.3 (*) 12.0 - 15.0 g/dL   HCT 96.0 (*) 45.4 - 09.8 %   MCV 95.5  78.0 - 100.0 fL   MCH 33.7  26.0 - 34.0 pg   MCHC 35.3  30.0 - 36.0 g/dL   RDW 11.9  14.7 - 82.9 %   Platelets 386  150 - 400 K/uL   Neutrophils Relative % 74  43 - 77 %   Neutro Abs 9.1 (*) 1.7 - 7.7 K/uL   Lymphocytes Relative 18  12 - 46 %   Lymphs Abs 2.2  0.7 - 4.0 K/uL   Monocytes Relative 5  3 - 12 %   Monocytes Absolute 0.6  0.1 - 1.0 K/uL   Eosinophils Relative 3  0 - 5 %   Eosinophils Absolute 0.3  0.0 - 0.7 K/uL   Basophils Relative 0  0 - 1 %   Basophils Absolute 0.1  0.0 - 0.1 K/uL  BASIC METABOLIC PANEL     Status: Abnormal   Collection Time    12/29/12  6:34 PM      Result Value Range   Sodium 141  135 - 145 mEq/L   Potassium 3.5  3.5 - 5.1 mEq/L   Chloride 101  96 - 112 mEq/L   CO2 22  19 - 32 mEq/L   Glucose, Bld 186 (*)  70 - 99 mg/dL   BUN 10  6 - 23 mg/dL   Creatinine, Ser 5.62  0.50 - 1.10 mg/dL   Calcium 9.2  8.4 - 13.0 mg/dL   GFR calc non Af Amer >90  >90 mL/min   GFR calc Af Amer >90  >90 mL/min   Comment: (NOTE)     The eGFR has been calculated using the CKD EPI equation.     This calculation has not been validated in all clinical situations.     eGFR's persistently <90 mL/min signify possible Chronic Kidney     Disease.  PRO B NATRIURETIC PEPTIDE     Status: Abnormal   Collection Time    12/29/12  6:34 PM      Result Value Range   Pro B Natriuretic peptide (BNP) 441.9 (*) 0 - 125 pg/mL  TROPONIN I     Status: Abnormal   Collection Time    12/29/12  6:34 PM      Result Value Range   Troponin I 0.33 (*) <0.30 ng/mL   Comment:            Due to the release kinetics of cTnI,     a negative result within the first hours     of the onset of symptoms does not rule out     myocardial infarction with certainty.     If myocardial infarction is still suspected,     repeat the test at appropriate intervals.     REPEATED TO VERIFY     CRITICAL RESULT CALLED TO, READ BACK BY AND VERIFIED WITH:     J GATE,RN 2120 12/29/12 WBOND  POCT I-STAT TROPONIN I     Status: Abnormal   Collection Time    12/29/12  7:23 PM      Result Value Range   Troponin i, poc 0.13 (*) 0.00 - 0.08 ng/mL   Comment NOTIFIED PHYSICIAN     Comment 3            Comment: Due to the  release kinetics of cTnI,     a negative result within the first hours     of the onset of symptoms does not rule out     myocardial infarction with certainty.     If myocardial infarction is still suspected,     repeat the test at appropriate intervals.  CG4 I-STAT (LACTIC ACID)     Status: None   Collection Time    12/29/12  7:25 PM      Result Value Range   Lactic Acid, Venous 2.11  0.5 - 2.2 mmol/L  POCT I-STAT 3, BLOOD GAS (G3+)     Status: Abnormal   Collection Time    12/29/12  7:32 PM      Result Value Range   pH, Arterial 7.312 (*)  7.350 - 7.450   pCO2 arterial 47.0 (*) 35.0 - 45.0 mmHg   pO2, Arterial 111.0 (*) 80.0 - 100.0 mmHg   Bicarbonate 23.8  20.0 - 24.0 mEq/L   TCO2 25  0 - 100 mmol/L   O2 Saturation 98.0     Acid-base deficit 3.0 (*) 0.0 - 2.0 mmol/L   Patient temperature 98.6 F     Collection site RADIAL, ALLEN'S TEST ACCEPTABLE     Drawn by Operator     Sample type ARTERIAL    MRSA PCR SCREENING     Status: None   Collection Time    12/30/12  1:15 AM      Result Value Range   MRSA by PCR NEGATIVE  NEGATIVE   Comment:            The GeneXpert MRSA Assay (FDA     approved for NASAL specimens     only), is one component of a     comprehensive MRSA colonization     surveillance program. It is not     intended to diagnose MRSA     infection nor to guide or     monitor treatment for     MRSA infections.  TROPONIN I     Status: Abnormal   Collection Time    12/30/12  2:00 AM      Result Value Range   Troponin I 1.10 (*) <0.30 ng/mL   Comment:            Due to the release kinetics of cTnI,     a negative result within the first hours     of the onset of symptoms does not rule out     myocardial infarction with certainty.     If myocardial infarction is still suspected,     repeat the test at appropriate intervals.     CRITICAL VALUE NOTED.  VALUE IS CONSISTENT WITH PREVIOUSLY REPORTED AND CALLED VALUE.     REPEATED TO VERIFY  BASIC METABOLIC PANEL     Status: Abnormal   Collection Time    12/30/12  4:19 AM      Result Value Range   Sodium 137  135 - 145 mEq/L   Potassium 3.4 (*) 3.5 - 5.1 mEq/L   Chloride 100  96 - 112 mEq/L   CO2 21  19 - 32 mEq/L   Glucose, Bld 194 (*) 70 - 99 mg/dL   BUN 13  6 - 23 mg/dL   Creatinine, Ser 1.61  0.50 - 1.10 mg/dL   Calcium 8.8  8.4 - 09.6 mg/dL   GFR calc non Af Amer >90  >90 mL/min   GFR calc Af Amer >90  >  90 mL/min   Comment: (NOTE)     The eGFR has been calculated using the CKD EPI equation.     This calculation has not been validated in all  clinical situations.     eGFR's persistently <90 mL/min signify possible Chronic Kidney     Disease.  CBC     Status: None   Collection Time    12/30/12  4:19 AM      Result Value Range   WBC 6.8  4.0 - 10.5 K/uL   RBC 4.32  3.87 - 5.11 MIL/uL   Hemoglobin 14.4  12.0 - 15.0 g/dL   HCT 82.9  56.2 - 13.0 %   MCV 95.1  78.0 - 100.0 fL   MCH 33.3  26.0 - 34.0 pg   MCHC 35.0  30.0 - 36.0 g/dL   RDW 86.5  78.4 - 69.6 %   Platelets 341  150 - 400 K/uL    Dg Chest Portable 1 View  12/29/2012   CLINICAL DATA:  Respiratory distress  EXAM: PORTABLE CHEST - 1 VIEW  COMPARISON:  [id number]  FINDINGS: The heart size and mediastinal contours are within normal limits. The lungs are hyperexpanded but clear. The visualized skeletal structures are demineralized but otherwise unremarkable.  IMPRESSION: No acute cardiopulmonary disease   Electronically Signed   By: Amie Portland   On: 12/29/2012 19:04    ROS Blood pressure 110/63, pulse 87, temperature 97.5 F (36.4 C), temperature source Oral, resp. rate 15, height 5' 3.5" (1.613 m), weight 70.1 kg (154 lb 8.7 oz), SpO2 98.00%. Physical Exam Labs reviewed; Troponin 0.3 to 1.0 ECG reviewed; sinus tachycardia, nonspecific ST changes  Problem List Shortness of breath, fever Elevated troponin/NSTEMI COPD Tobacco use Hypertension Fever  Assessment/Plan: 67 yo woman with long-time tobacco use, COPD, hypertension admitted with shortness of breath, fever being treated with levofloxacin and found to have elevated troponin. Differential is ACS vs. Type II nstemi in setting of respiratory distress. Appreciate hospitalist service. Continue telemetry, trend enyzmes, continue daily aspirin 81 mg, heparin gtt. Will likely pursue LHC on Monday unless clinical story changes over the weekend.  - heparin gtt, daily aspirin 81 mg - please make NPO after MN on Sunday evening as long as patient's respiratory status is more improved for likely LHC - trend enzymes -  smoking cessation counseling initiated - blood pressure control as you are  Rayvin Abid 12/30/2012, 5:41 AM

## 2012-12-30 NOTE — Progress Notes (Signed)
ANTICOAGULATION CONSULT NOTE - Follow Up Consult  Pharmacy Consult for Heparin Indication: chest pain/ACS  No Known Allergies  Patient Measurements: Height: 5' 3.5" (161.3 cm) Weight: 154 lb 8.7 oz (70.1 kg) IBW/kg (Calculated) : 53.55 \ Vital Signs: Temp: 98.9 F (37.2 C) (09/27 1149) Temp src: Oral (09/27 1149) BP: 142/71 mmHg (09/27 1149) Pulse Rate: 90 (09/27 1149)  Labs:  Recent Labs  12/29/12 1834 12/30/12 0200 12/30/12 0419 12/30/12 0820 12/30/12 0955 12/30/12 1400 12/30/12 1535  HGB 16.3*  --  14.4  --  15.0  --   --   HCT 46.2*  --  41.1  --  42.4  --   --   PLT 386  --  341  --  328  --   --   HEPARINUNFRC  --   --   --   --  0.33  --  0.18*  CREATININE 0.62  --  0.56  --   --   --   --   TROPONINI 0.33* 1.10*  --  1.07*  --  0.66*  --     Estimated Creatinine Clearance: 65.7 ml/min (by C-G formula based on Cr of 0.56).   Medications:  Scheduled:  . albuterol  2.5 mg Nebulization Q6H  . amLODipine  10 mg Oral Daily  . aspirin EC  325 mg Oral Daily  . cloNIDine  0.1 mg Oral BID  . [START ON 12/31/2012] influenza vac split quadrivalent PF  0.5 mL Intramuscular Tomorrow-1000  . ipratropium  0.5 mg Nebulization Q6H  . [START ON 12/31/2012] levofloxacin  500 mg Oral Daily  . nicotine  14 mg Transdermal Daily  . nitroGLYCERIN  1 inch Topical Q6H  . [START ON 12/31/2012] pneumococcal 23 valent vaccine  0.5 mL Intramuscular Tomorrow-1000  . predniSONE  50 mg Oral Q breakfast  . simvastatin  10 mg Oral q1800    Assessment: 67yo female with NSTEMI.  Heparin level is below goal (HL= 0.18; heparin level this am was 0.33) on 900 units/hr. No infusion interruptions noted per RN.   Goal of Therapy:  Heparin level 0.3-0.7 units/ml Monitor platelets by anticoagulation protocol: Yes   Plan:  -Heparin bolus 2000 units followed by increase to 1100 units/hr -Heparin level in 6 hours and daily wth CBC daily  Harland German, Pharm D 12/30/2012 5:47 PM

## 2012-12-30 NOTE — Progress Notes (Signed)
ANTICOAGULATION CONSULT NOTE - Follow Up Consult  Pharmacy Consult for Heparin Indication: chest pain/ACS  No Known Allergies  Patient Measurements: Height: 5' 3.5" (161.3 cm) Weight: 154 lb 8.7 oz (70.1 kg) IBW/kg (Calculated) : 53.55 Heparin Dosing Weight:   Vital Signs: Temp: 97.9 F (36.6 C) (09/27 0716) Temp src: Axillary (09/27 0716) BP: 132/89 mmHg (09/27 0716) Pulse Rate: 81 (09/27 0910)  Labs:  Recent Labs  12/29/12 1834 12/30/12 0200 12/30/12 0419 12/30/12 0820 12/30/12 0955  HGB 16.3*  --  14.4  --  15.0  HCT 46.2*  --  41.1  --  42.4  PLT 386  --  341  --  328  HEPARINUNFRC  --   --   --   --  0.33  CREATININE 0.62  --  0.56  --   --   TROPONINI 0.33* 1.10*  --  1.07*  --     Estimated Creatinine Clearance: 65.7 ml/min (by C-G formula based on Cr of 0.56).   Medications:  Scheduled:  . sodium chloride   Intravenous STAT  . amLODipine  10 mg Oral Daily  . aspirin EC  325 mg Oral Daily  . cloNIDine  0.1 mg Oral BID  . [START ON 12/31/2012] influenza vac split quadrivalent PF  0.5 mL Intramuscular Tomorrow-1000  . levofloxacin (LEVAQUIN) IV  500 mg Intravenous Q24H  . [START ON 12/31/2012] levofloxacin (LEVAQUIN) IV  500 mg Intravenous Q24H  . nicotine  14 mg Transdermal Daily  . nitroGLYCERIN  1 inch Topical Q6H  . nitroGLYCERIN  1 inch Topical Q6H  . [START ON 12/31/2012] pneumococcal 23 valent vaccine  0.5 mL Intramuscular Tomorrow-1000  . simvastatin  10 mg Oral q1800    Assessment: 67yo female with NSTEMI.  Heparin level therapeutic on 900 units/hr.  Hg and pltc are wnl.  No bleeding problems noted.  Goal of Therapy:  Heparin level 0.3-0.7 units/ml Monitor platelets by anticoagulation protocol: Yes   Plan:  1.  Continue heparin 900 units/hr 2.  Repeat HL in 6hr to verify 3.  F/U in AM  Marisue Humble, PharmD Clinical Pharmacist [facility]

## 2012-12-30 NOTE — Progress Notes (Signed)
ANTICOAGULATION CONSULT NOTE - Initial Consult  Pharmacy Consult for heparin Indication: chest pain/ACS  No Known Allergies  Patient Measurements: Height: 5' 3.5" (161.3 cm) Weight: 154 lb 8.7 oz (70.1 kg) IBW/kg (Calculated) : 53.55  Vital Signs: Temp: 97.5 F (36.4 C) (09/27 0050) Temp src: Oral (09/27 0050) BP: 155/85 mmHg (09/27 0050) Pulse Rate: 113 (09/27 0050)  Labs:  Recent Labs  12/29/12 1834  HGB 16.3*  HCT 46.2*  PLT 386  CREATININE 0.62  TROPONINI 0.33*    Estimated Creatinine Clearance: 65.7 ml/min (by C-G formula based on Cr of 0.62).   Medical History: Past Medical History  Diagnosis Date  . Arthritis   . Knee pain   . Hip pain   . Hypertension   . Kidney stones   . Irregular heart beat     Medications:  Prescriptions prior to admission  Medication Sig Dispense Refill  . amLODipine (NORVASC) 10 MG tablet Take 10 mg by mouth daily.        . cloNIDine (CATAPRES) 0.1 MG tablet Take 0.1 mg by mouth 2 (two) times daily.        [name] lovastatin (MEVACOR) 20 MG tablet Take 20 mg by mouth daily.        . Olmesartan-Amlodipine-HCTZ (TRIBENZOR) 40-5-12.5 MG TABS Take by mouth.      . TRAMADOL HCL, BIPHASIC, PO Take by mouth.         Scheduled:  . sodium chloride   Intravenous STAT  . amLODipine  10 mg Oral Daily  . aspirin EC  325 mg Oral Daily  . cloNIDine  0.1 mg Oral BID  . levofloxacin (LEVAQUIN) IV  500 mg Intravenous Q24H  . [START ON 12/31/2012] levofloxacin (LEVAQUIN) IV  500 mg Intravenous Q24H  . nitroGLYCERIN  1 inch Topical Q6H  . nitroGLYCERIN  1 inch Topical Q6H  . simvastatin  10 mg Oral q1800   Infusions:  . sodium chloride      Assessment: 67yo female c/o SOB and chest tightness, initial troponin mildly elevated, to begin heparin.  Goal of Therapy:  Heparin level 0.3-0.7 units/ml Monitor platelets by anticoagulation protocol: Yes   Plan:  Will give heparin 4000 units IV bolus x1 followed by gtt at 900 units/hr and monitor  heparin levels and CBC.  Vernard Gambles, PharmD, BCPS  12/30/2012,1:25 AM

## 2012-12-30 NOTE — H&P (Signed)
[facility] History and Physical  Brandy Copeland AVW:[id number] DOB: Jul 27, 1945 DOA: 12/29/2012  Referring physician: EDP PCP: Cleopatra Cedar, NP  Specialists:   Chief Complaint: SOB  HPI: Brandy Copeland is a 67 y.o. female with a history of COPD and CAD who presents to the ED with complaints of worsening SOB and Chest tightness X 2 days .  She reports having increased cough productive of whitish mucus, and she denies having any fevers or chills.   She denies having chest pain.  In the ED she was found to have hypoxemia and was placed on BIPAP and she began to improve on BIPAP, and was referred for medical admission.   She was found to have a temperature of 100.3 in the ED and was placed on IV Levaquin.      Review of Systems: The patient denies anorexia, fever, chills, headaches, weight loss, vision loss, diplopia, dizziness, decreased hearing, rhinitis, hoarseness, chest pain, syncope, dyspnea on exertion, peripheral edema, balance deficits, cough, hemoptysis, abdominal pain, nausea, vomiting, diarrhea, constipation, hematemesis, melena, hematochezia, severe indigestion/heartburn, dysuria, hematuria, incontinence, muscle weakness, suspicious skin lesions, transient blindness, difficulty walking, depression, unusual weight change, abnormal bleeding, enlarged lymph nodes, angioedema, and breast masses.    Past Medical History  Diagnosis Date  . Arthritis   . Knee pain   . Hip pain   . Hypertension   . Kidney stones   . Irregular heart beat     Past Surgical History  Procedure Laterality Date  . Breast biopsy      Prior to Admission medications   Medication Sig Start Date End Date Taking? Authorizing Provider  amLODipine (NORVASC) 10 MG tablet Take 10 mg by mouth daily.      Historical Provider, MD  cloNIDine (CATAPRES) 0.1 MG tablet Take 0.1 mg by mouth 2 (two) times daily.      Historical Provider, MD  lovastatin (MEVACOR) 20 MG tablet Take 20 mg by mouth daily.       Historical Provider, MD  Olmesartan-Amlodipine-HCTZ (TRIBENZOR) 40-5-12.5 MG TABS Take by mouth.    Historical Provider, MD  TRAMADOL HCL, BIPHASIC, PO Take by mouth.      Historical Provider, MD    No Known Allergies    Social History:  reports that she has been smoking Cigarettes.  She has been smoking about 0.50 packs per day. She does not have any smokeless tobacco history on file. She reports that she does not drink alcohol. Her drug history is not on file.     Family History  Problem Relation Age of Onset  . Diabetes Father     Amputation  . Hypertension Father   . Hyperlipidemia Father   . Heart disease Father     Heart Disease before age 62  . Breast cancer Mother   . Cancer Mother     Breast      Physical Exam:  GEN:  Pleasant Obese Elderly  67 y.o. African American female  examined  and in no acute distress; cooperative with exam Filed Vitals:   12/29/12 2248 12/29/12 2345 12/30/12 0030 12/30/12 0050  BP: 118/70 111/64 138/78 155/85  Pulse: 107 105 104 113  Temp:    97.5 F (36.4 C)  TempSrc:    Oral  Resp: 17 18 17 28   Height:    5' 3.5" (1.613 m)  Weight:    70.1 kg (154 lb 8.7 oz)  SpO2: 97% 98% 99% 99%   Blood pressure 155/85, pulse 113, temperature 97.5 F (  36.4 C), temperature source Oral, resp. rate 28, height 5' 3.5" (1.613 m), weight 70.1 kg (154 lb 8.7 oz), SpO2 99.00%. PSYCH: She is alert and oriented x4; does not appear anxious does not appear depressed; affect is normal HEENT: Normocephalic and Atraumatic, Mucous membranes pink; PERRLA; EOM intact; Fundi:  Benign;  No scleral icterus, Nares: Patent, Oropharynx: Clear, Fair Dentition, Neck:  FROM, no cervical lymphadenopathy nor thyromegaly or carotid bruit; no JVD; Breasts:: Not examined CHEST WALL: No tenderness CHEST: Normal respiration, clear to auscultation bilaterally HEART: Regular rate and rhythm; no murmurs rubs or gallops BACK: No kyphosis or scoliosis; no CVA tenderness ABDOMEN:  Positive Bowel Sounds, Obese, soft non-tender; no masses, no organomegaly, no pannus; no intertriginous candida. Rectal Exam: Not done EXTREMITIES: No cyanosis, clubbing or edema; no ulcerations. Genitalia: not examined PULSES: 2+ and symmetric SKIN: Normal hydration no rash or ulceration CNS: Cranial nerves 2-12 grossly intact no focal neurologic deficit    Labs on Admission:  Basic Metabolic Panel:  Recent Labs Lab 12/29/12 1834  NA 141  K 3.5  CL 101  CO2 22  GLUCOSE 186*  BUN 10  CREATININE 0.62  CALCIUM 9.2   Liver Function Tests: No results found for this basename: AST, ALT, ALKPHOS, BILITOT, PROT, ALBUMIN,  in the last 168 hours No results found for this basename: LIPASE, AMYLASE,  in the last 168 hours No results found for this basename: AMMONIA,  in the last 168 hours CBC:  Recent Labs Lab 12/29/12 1834  WBC 12.3*  NEUTROABS 9.1*  HGB 16.3*  HCT 46.2*  MCV 95.5  PLT 386   Cardiac Enzymes:  Recent Labs Lab 12/29/12 1834  TROPONINI 0.33*    BNP (last 3 results)  Recent Labs  12/29/12 1834  PROBNP 441.9*   CBG: No results found for this basename: GLUCAP,  in the last 168 hours  Radiological Exams on Admission: Dg Chest Portable 1 View  12/29/2012   CLINICAL DATA:  Respiratory distress  EXAM: PORTABLE CHEST - 1 VIEW  COMPARISON:  [id number]  FINDINGS: The heart size and mediastinal contours are within normal limits. The lungs are hyperexpanded but clear. The visualized skeletal structures are demineralized but otherwise unremarkable.  IMPRESSION: No acute cardiopulmonary disease   Electronically Signed   By: Amie Portland   On: 12/29/2012 19:04     EKG: Independently reviewed. Sinus Tachycardia with minimal ST elevation in Inferior Leads, Q-Waves in Anterior lead ( Old changes from previous MI)   Assessment/Plan Principal Problem:   COPD exacerbation Active Problems:   HTN (hypertension)   Elevated troponin level   Sinus tachycardia   Tobacco  use disorder  Acute Respiratory Failure  CAD hx  Hyperlipidemia     1.   COPD Exacerbation-   Placed on BIPAP, IV Steroids taper and DuoNebs.   And Empiric IV Levaquin.     2.   Elevated Troponin- Denies Chest pain has Chest Tightness, Hx CAD, and ?ACS versus Demand Ischemia, cycle Troponins, and placed on Nitropaste O2, and ASA RX and IV heparin and Cardiology Consulted .  Dr. Leeann Must to see.   3.   HTN-  Continue    IV Hydralazine PRN.    4.   Sinus Tachycardia-  Reactive, monitor   5.   Acute Respiratory Failure-  Placed on BiPAP.    6.   CAD hx- continue ARB and Statin Rx.   7.    Hyperlipidemia- continue statin Rx.  Check Lipids in AM.  8.    IV heparin started.       Code Status:    FULL CODE  Family Communication:    Family at Bedside Disposition Plan:       Inpatient  Time spent:  31 Minutes  Ron Parker [facility] Pager [phone]  If 7PM-7AM, please contact night-coverage [email or web address] Password [facility] 12/30/2012, 2:41 AM

## 2012-12-30 NOTE — Progress Notes (Signed)
Subjective:  Reports breathing and tightness improved overnight.   Objective:  Vital Signs in the last 24 hours: BP 132/89  Pulse 81  Temp(Src) 97.9 F (36.6 C) (Axillary)  Resp 16  Ht 5' 3.5" (1.613 m)  Wt 70.1 kg (154 lb 8.7 oz)  BMI 26.94 kg/m2  SpO2 98%  Physical Exam: Pleasant BF in NAD Lungs:  Diffuse wheezing bilaterally Cardiac:  Regular rhythm, normal S1 and S2, no S3 Abdomen:  Soft, nontender, no masses Extremities:  No edema present  Intake/Output from previous day: 09/26 0701 - 09/27 0700 In: 327 [I.V.:327] Out: -   Weight Filed Weights   12/30/12 0050  Weight: 70.1 kg (154 lb 8.7 oz)    Lab Results: Basic Metabolic Panel:  Recent Labs  16/10/96 1834 12/30/12 0419  NA 141 137  K 3.5 3.4*  CL 101 100  CO2 22 21  GLUCOSE 186* 194*  BUN 10 13  CREATININE 0.62 0.56   CBC:  Recent Labs  12/29/12 1834 12/30/12 0419  WBC 12.3* 6.8  NEUTROABS 9.1*  --   HGB 16.3* 14.4  HCT 46.2* 41.1  MCV 95.5 95.1  PLT 386 341   Cardiac Enzymes:  Recent Labs  12/29/12 1834 12/30/12 0200 12/30/12 0820  TROPONINI 0.33* 1.10* 1.07*    Telemetry: Sinus rhythm  Assessment/Plan:  1. NonSTEMI  likely due to demand ischemia 2. Significant exacerbation of COPD 3. Hypertensive heart disease 4. Hyperlipidemia  Rec:  Treat COPD and check ECHO.  Continue heparin.     Darden Palmer  MD [facility]  12/30/2012, 9:55 AM

## 2012-12-31 DIAGNOSIS — I517 Cardiomegaly: Secondary | ICD-10-CM

## 2012-12-31 LAB — TROPONIN I: Troponin I: <0.3 ng/mL (ref <0.30)

## 2012-12-31 LAB — CBC
HCT: 42.4 % (ref 36.0–46.0)
MCH: 33.3 pg (ref 26.0–34.0)
MCHC: 34.7 g/dL (ref 30.0–36.0)
MCV: 95.9 fL (ref 78.0–100.0)
Platelets: 352 10*3/uL (ref 150–400)
RDW: 15.4 % (ref 11.5–15.5)

## 2012-12-31 LAB — BASIC METABOLIC PANEL
BUN: 10 mg/dL (ref 6–23)
Calcium: 9.4 mg/dL (ref 8.4–10.5)
Chloride: 104 mEq/L (ref 96–112)
Creatinine, Ser: 0.52 mg/dL (ref 0.50–1.10)
GFR calc Af Amer: >90 mL/min (ref >90)
GFR calc non Af Amer: >90 mL/min (ref >90)
Glucose, Bld: 116 mg/dL — ABNORMAL HIGH (ref 70–99)

## 2012-12-31 LAB — HEPARIN LEVEL (UNFRACTIONATED): Heparin Unfractionated: 0.65 IU/mL (ref 0.30–0.70)

## 2012-12-31 MED ORDER — METHYLPREDNISOLONE SODIUM SUCC 125 MG IJ SOLR
125.0000 mg | Freq: Four times a day (QID) | INTRAMUSCULAR | Status: DC
  Administered 2012-12-31 – 2013-01-03 (×12): 125 mg via INTRAVENOUS
  Filled 2012-12-31 (×16): qty 2

## 2012-12-31 MED ORDER — HEPARIN BOLUS VIA INFUSION
2000.0000 [IU] | Freq: Once | INTRAVENOUS | Status: AC
  Administered 2012-12-31: 2000 [IU] via INTRAVENOUS
  Filled 2012-12-31: qty 2000

## 2012-12-31 MED ORDER — HEPARIN (PORCINE) IN NACL 100-0.45 UNIT/ML-% IJ SOLN
1300.0000 [IU]/h | INTRAMUSCULAR | Status: DC
  Filled 2012-12-31 (×3): qty 250

## 2012-12-31 MED ORDER — GUAIFENESIN 100 MG/5ML PO SYRP
200.0000 mg | ORAL_SOLUTION | ORAL | Status: DC | PRN
  Administered 2012-12-31 (×2): 200 mg via ORAL
  Filled 2012-12-31 (×2): qty 10

## 2012-12-31 NOTE — Progress Notes (Signed)
Echocardiogram 2D Echocardiogram has been performed.  Dorothey Baseman 12/31/2012, 11:20 AM

## 2012-12-31 NOTE — Progress Notes (Signed)
ANTICOAGULATION CONSULT NOTE - Follow Up Consult  Pharmacy Consult for Heparin Indication: chest pain/ACS  No Known Allergies  Patient Measurements: Height: 5' 3.5" (161.3 cm) Weight: 154 lb 8.7 oz (70.1 kg) IBW/kg (Calculated) : 53.55 Heparin Dosing Weight:   Vital Signs: Temp: 98.8 F (37.1 C) (09/28 1916) Temp src: Oral (09/28 1916) BP: 137/55 mmHg (09/28 1916) Pulse Rate: 86 (09/28 1916)  Labs:  Recent Labs  12/29/12 1834  12/30/12 0419 12/30/12 0820 12/30/12 0955 12/30/12 1400  12/31/12 0044 12/31/12 0830 12/31/12 0836 12/31/12 1905  HGB 16.3*  --  14.4  --  15.0  --   --   --  14.7  --   --   HCT 46.2*  --  41.1  --  42.4  --   --   --  42.4  --   --   PLT 386  --  341  --  328  --   --   --  352  --   --   HEPARINUNFRC  --   --   --   --  0.33  --   < > <0.10* 1.04*  --  0.65  CREATININE 0.62  --  0.56  --   --   --   --   --  0.52  --   --   TROPONINI 0.33*  < >  --  1.07*  --  0.66*  --   --   --  <0.30  --   < > = values in this interval not displayed.  Estimated Creatinine Clearance: 65.7 ml/min (by C-G formula based on Cr of 0.52).   Medications:  Scheduled:  . albuterol  2.5 mg Nebulization Q6H  . amLODipine  10 mg Oral Daily  . aspirin EC  325 mg Oral Daily  . cloNIDine  0.1 mg Oral BID  . ipratropium  0.5 mg Nebulization Q6H  . levofloxacin  500 mg Oral Daily  . methylPREDNISolone (SOLU-MEDROL) injection  125 mg Intravenous Q6H  . nicotine  14 mg Transdermal Daily  . simvastatin  10 mg Oral q1800    Assessment: 67yo female with NSTEMI.  Heparin level now therapeutic on 1300 units/hr.   No bleeding problems noted.  Goal of Therapy:  Heparin level 0.3-0.7 units/ml Monitor platelets by anticoagulation protocol: Yes   Plan:  1.  Continue IV heparin at current rate. 2. F/U AM heparin level.  Tad Moore, BCPS  Clinical Pharmacist Pager [phone]  12/31/2012 8:50 PM

## 2012-12-31 NOTE — Progress Notes (Signed)
Subjective:  Still wheezing significantly but breathing is better overnight. She is not really having any chest discomfort although she has significant anterior T wave inversions daily. Her troponin is trending down.  Objective:  Vital Signs in the last 24 hours: BP 148/77  Pulse 83  Temp(Src) 98.3 F (36.8 C) (Oral)  Resp 29  Ht 5' 3.5" (1.613 m)  Wt 70.1 kg (154 lb 8.7 oz)  BMI 26.94 kg/m2  SpO2 94%  Physical Exam: Pleasant BF in NAD Lungs:  Diffuse wheezing bilaterally Cardiac:  Regular rhythm, normal S1 and S2, no S3 Abdomen:  Soft, nontender, no masses Extremities:  No edema present  Intake/Output from previous day: 09/27 0701 - 09/28 0700 In: 484 [I.V.:484] Out: 2425 [Urine:2425]  Weight Filed Weights   12/30/12 0050  Weight: 70.1 kg (154 lb 8.7 oz)    Lab Results: Basic Metabolic Panel:  Recent Labs  40/98/11 0419 12/31/12 0830  NA 137 141  K 3.4* 3.5  CL 100 104  CO2 21 24  GLUCOSE 194* 116*  BUN 13 10  CREATININE 0.56 0.52   CBC:  Recent Labs  12/29/12 1834  12/30/12 0955 12/31/12 0830  WBC 12.3*  < > 8.4 13.1*  NEUTROABS 9.1*  --   --   --   HGB 16.3*  < > 15.0 14.7  HCT 46.2*  < > 42.4 42.4  MCV 95.5  < > 94.9 95.9  PLT 386  < > 328 352  < > = values in this interval not displayed. Cardiac Enzymes:  Recent Labs  12/30/12 0820 12/30/12 1400 12/31/12 0836  TROPONINI 1.07* 0.66* <0.30    Telemetry: Sinus rhythm  Assessment/Plan:  1. NonSTEMI  likely due to demand ischemia-or significant anterior T wave inversions could represent Takasubo syndrome or significant anterior ischemia. She is still wheezing significantly so I would continue her heparin for the time being and check an echocardiogram to see what her LV function is. 2. Significant exacerbation of COPD 3. Hypertensive heart disease 4. Hyperlipidemia  Rec:  Treat COPD and check ECHO.  Continue heparin.     Darden Palmer  MD [facility]  12/31/2012,  9:50 AM

## 2012-12-31 NOTE — Progress Notes (Addendum)
Patient ID: Brandy Copeland, female   DOB: Oct 26, 1945, 67 y.o.   MRN: [id number] [facility] PROGRESS NOTE  Toshi Ishii WUJ:[id number] DOB: 1945/05/04 DOA: 12/29/2012 PCP: Cleopatra Cedar, NP  Assessment/Plan:   COPD exacerbation: coughing and wheezing worse this am.  02 sats remain in the 90%'s on room air at rest and even with coughing.  Will change from oral prednisone to IV salmeterol.  Continue with nebs.  Continue levaquin. NSTEMI: Troponin elevation due to demand ischemia.  Cardiology following.  Continues on heparin gtt.  Troponin had decreased yesterday afternoon.  Result from this am is pending.  No chest pain.  Discussed with Dr. Donnie Aho- recent EKG changes worrysome for ongoing inschemia.  Will work to control COPD, will likely need at cath in next few days.   Hypertension: controled. Continue with amlodipine, clonidine, hydralazine.   Tobacco use disorder: smoking cessation discussed. She is interested in prescription for nicoderm at discharge.    Hyperlipidemia: continue statin Hyperglycemia (prior to steriods): a1C pending  Code Status: FULL Family Communication: spoke with patient at bedside. Disposition Plan: will remain in SDU   Consultants:  cardiology  Procedures:  none  Antibiotics: levoquin started 9/26 for COPD exacerbation   HPI/Subjective:  67 year old woman with history of COPD, CAD, HTN presented 9/26 with shortness of breath and fever. Now treating for COPD exacerbation and NSTEMI. Initially on BiPAP, now with sats in 90's on room air.  Today is bothered by coughing and wheezing.  No respiratory distress between coughing.  No chest pain.   Objective: Filed Vitals:   12/31/12 0759  BP:   Pulse:   Temp: 98.3 F (36.8 C)  Resp:     Intake/Output Summary (Last 24 hours) at 12/31/12 0831 Last data filed at 12/31/12 0600  Gross per 24 hour  Intake    386 ml  Output   2075 ml  Net  -1689 ml   Filed Weights   12/30/12 0050  Weight: 70.1  kg (154 lb 8.7 oz)    Exam:   General:  Alert, oriented, no distress except when coughing   Cardiovascular: RRR no mrg  Respiratory: very loud wheezes throughout, lots of coughing  Abdomen: soft, non tender, bs normal  Musculoskeletal: no edema, no bruising  Data Reviewed: Basic Metabolic Panel:  Recent Labs Lab 12/29/12 1834 12/30/12 0419  NA 141 137  K 3.5 3.4*  CL 101 100  CO2 22 21  GLUCOSE 186* 194*  BUN 10 13  CREATININE 0.62 0.56  CALCIUM 9.2 8.8   Liver Function Tests: No results found for this basename: AST, ALT, ALKPHOS, BILITOT, PROT, ALBUMIN,  in the last 168 hours No results found for this basename: LIPASE, AMYLASE,  in the last 168 hours No results found for this basename: AMMONIA,  in the last 168 hours CBC:  Recent Labs Lab 12/29/12 1834 12/30/12 0419 12/30/12 0955  WBC 12.3* 6.8 8.4  NEUTROABS 9.1*  --   --   HGB 16.3* 14.4 15.0  HCT 46.2* 41.1 42.4  MCV 95.5 95.1 94.9  PLT 386 341 328   Cardiac Enzymes:  Recent Labs Lab 12/29/12 1834 12/30/12 0200 12/30/12 0820 12/30/12 1400  TROPONINI 0.33* 1.10* 1.07* 0.66*   BNP (last 3 results)  Recent Labs  12/29/12 1834  PROBNP 441.9*   CBG: No results found for this basename: GLUCAP,  in the last 168 hours  Recent Results (from the past 240 hour(s))  MRSA PCR SCREENING     Status:  None   Collection Time    12/30/12  1:15 AM      Result Value Range Status   MRSA by PCR NEGATIVE  NEGATIVE Final   Comment:            The GeneXpert MRSA Assay (FDA     approved for NASAL specimens     only), is one component of a     comprehensive MRSA colonization     surveillance program. It is not     intended to diagnose MRSA     infection nor to guide or     monitor treatment for     MRSA infections.     Studies: Dg Chest Portable 1 View  12/29/2012   CLINICAL DATA:  Respiratory distress  EXAM: PORTABLE CHEST - 1 VIEW  COMPARISON:  [id number]  FINDINGS: The heart size and mediastinal  contours are within normal limits. The lungs are hyperexpanded but clear. The visualized skeletal structures are demineralized but otherwise unremarkable.  IMPRESSION: No acute cardiopulmonary disease   Electronically Signed   By: Amie Portland   On: 12/29/2012 19:04    Scheduled Meds: . albuterol  2.5 mg Nebulization Q6H  . amLODipine  10 mg Oral Daily  . aspirin EC  325 mg Oral Daily  . cloNIDine  0.1 mg Oral BID  . influenza vac split quadrivalent PF  0.5 mL Intramuscular Tomorrow-1000  . ipratropium  0.5 mg Nebulization Q6H  . levofloxacin  500 mg Oral Daily  . nicotine  14 mg Transdermal Daily  . nitroGLYCERIN  1 inch Topical Q6H  . pneumococcal 23 valent vaccine  0.5 mL Intramuscular Tomorrow-1000  . predniSONE  50 mg Oral Q breakfast  . simvastatin  10 mg Oral q1800   Continuous Infusions: . heparin 1,400 Units/hr (12/31/12 0600)    Principal Problem:   COPD exacerbation Active Problems:   Hypertensive heart disease   Tobacco use disorder   Elevated troponin level   Sinus tachycardia   Hyperlipidemia    Time spent: 35 minutes    [facility]  [facility] Pager [phone]. If 7PM-7AM, please contact night-coverage at [email or web address], password [email or web address] 12/31/2012, 8:31 AM  LOS: 2 days

## 2012-12-31 NOTE — Progress Notes (Signed)
ANTICOAGULATION CONSULT NOTE - Follow Up Consult  Pharmacy Consult for heparin Indication: NSTEMI  Labs:  Recent Labs  12/29/12 1834 12/30/12 0200 12/30/12 0419 12/30/12 0820 12/30/12 0955 12/30/12 1400 12/30/12 1535 12/31/12 0044  HGB 16.3*  --  14.4  --  15.0  --   --   --   HCT 46.2*  --  41.1  --  42.4  --   --   --   PLT 386  --  341  --  328  --   --   --   HEPARINUNFRC  --   --   --   --  0.33  --  0.18* <0.10*  CREATININE 0.62  --  0.56  --   --   --   --   --   TROPONINI 0.33* 1.10*  --  1.07*  --  0.66*  --   --     Assessment: 67yo female now undetectable on heparin despite boluses and rate increases.  Goal of Therapy:  Heparin level 0.3-0.7 units/ml   Plan:  Will rebolus with heparin 2000 units x1 and increase gtt by 4 units/kg/hr to 1700 units/hr and check level in 6hr.  Vernard Gambles, PharmD, BCPS  12/31/2012,2:05 AM

## 2012-12-31 NOTE — Progress Notes (Signed)
ANTICOAGULATION CONSULT NOTE - Follow Up Consult  Pharmacy Consult for Heparin Indication: chest pain/ACS  No Known Allergies  Patient Measurements: Height: 5' 3.5" (161.3 cm) Weight: 154 lb 8.7 oz (70.1 kg) IBW/kg (Calculated) : 53.55 Heparin Dosing Weight:   Vital Signs: Temp: 98.3 F (36.8 C) (09/28 0759) Temp src: Oral (09/28 0759) BP: 148/77 mmHg (09/28 0759) Pulse Rate: 83 (09/28 0759)  Labs:  Recent Labs  12/29/12 1834  12/30/12 0419 12/30/12 0820  12/30/12 0955 12/30/12 1400 12/30/12 1535 12/31/12 0044 12/31/12 0830 12/31/12 0836  HGB 16.3*  --  14.4  --   --  15.0  --   --   --  14.7  --   HCT 46.2*  --  41.1  --   --  42.4  --   --   --  42.4  --   PLT 386  --  341  --   --  328  --   --   --  352  --   HEPARINUNFRC  --   --   --   --   < > 0.33  --  0.18* <0.10* 1.04*  --   CREATININE 0.62  --  0.56  --   --   --   --   --   --  0.52  --   TROPONINI 0.33*  < >  --  1.07*  --   --  0.66*  --   --   --  <0.30  < > = values in this interval not displayed.  Estimated Creatinine Clearance: 65.7 ml/min (by C-G formula based on Cr of 0.52).   Medications:  Scheduled:  . albuterol  2.5 mg Nebulization Q6H  . amLODipine  10 mg Oral Daily  . aspirin EC  325 mg Oral Daily  . cloNIDine  0.1 mg Oral BID  . ipratropium  0.5 mg Nebulization Q6H  . levofloxacin  500 mg Oral Daily  . methylPREDNISolone (SOLU-MEDROL) injection  125 mg Intravenous Q6H  . nicotine  14 mg Transdermal Daily  . simvastatin  10 mg Oral q1800    Assessment: 67yo female with NSTEMI.  Large jump in HL to 1.04, drawn appropriately.  Hg and pltc are wnl.  This may be somewhat reflective of bolus received.  No bleeding problems noted.  Goal of Therapy:  Heparin level 0.3-0.7 units/ml Monitor platelets by anticoagulation protocol: Yes   Plan:  1.  Hold heparin x 1hr, then resume at 1300 units/hr 2.  Heparin level 6 hrs after resumed. 3.  F/U in AM  Marisue Humble, PharmD Clinical  Pharmacist [facility]

## 2013-01-01 LAB — CBC
HCT: 39.9 % (ref 36.0–46.0)
Hemoglobin: 13.8 g/dL (ref 12.0–15.0)
MCHC: 34.6 g/dL (ref 30.0–36.0)
Platelets: 359 10*3/uL (ref 150–400)
RBC: 4.22 MIL/uL (ref 3.87–5.11)

## 2013-01-01 LAB — HEPARIN LEVEL (UNFRACTIONATED): Heparin Unfractionated: 0.72 IU/mL — ABNORMAL HIGH (ref 0.30–0.70)

## 2013-01-01 MED ORDER — ALBUTEROL SULFATE (5 MG/ML) 0.5% IN NEBU: 2.5000 mg | INHALATION_SOLUTION | RESPIRATORY_TRACT | Status: DC | PRN

## 2013-01-01 MED ORDER — IPRATROPIUM BROMIDE 0.02 % IN SOLN
0.5000 mg | Freq: Three times a day (TID) | RESPIRATORY_TRACT | Status: DC
  Administered 2013-01-01 – 2013-01-04 (×8): 0.5 mg via RESPIRATORY_TRACT
  Filled 2013-01-01 (×8): qty 2.5

## 2013-01-01 MED ORDER — ALBUTEROL SULFATE (5 MG/ML) 0.5% IN NEBU
2.5000 mg | INHALATION_SOLUTION | Freq: Three times a day (TID) | RESPIRATORY_TRACT | Status: DC
  Administered 2013-01-01 – 2013-01-04 (×8): 2.5 mg via RESPIRATORY_TRACT
  Filled 2013-01-01 (×8): qty 0.5

## 2013-01-01 MED ORDER — ASPIRIN 81 MG PO CHEW
324.0000 mg | CHEWABLE_TABLET | ORAL | Status: AC
  Administered 2013-01-02: 324 mg via ORAL
  Filled 2013-01-01: qty 4

## 2013-01-01 MED ORDER — HEPARIN (PORCINE) IN NACL 100-0.45 UNIT/ML-% IJ SOLN
1200.0000 [IU]/h | INTRAMUSCULAR | Status: DC
  Administered 2013-01-01: 1200 [IU]/h via INTRAVENOUS
  Filled 2013-01-01 (×2): qty 250

## 2013-01-01 NOTE — Progress Notes (Signed)
Utilization review completed.  

## 2013-01-01 NOTE — Progress Notes (Signed)
Pt watching video on understanding cardiac catherization

## 2013-01-01 NOTE — Progress Notes (Signed)
ANTICOAGULATION CONSULT NOTE - Follow Up Consult  Pharmacy Consult for Heparin Indication: NSTEMI  No Known Allergies  Patient Measurements: Height: 5' 3.5" (161.3 cm) Weight: 154 lb 8.7 oz (70.1 kg) IBW/kg (Calculated) : 53.55 Heparin Dosing Weight: 68kg  Vital Signs: Temp: 97.9 F (36.6 C) (09/29 0712) Temp src: Oral (09/29 0712) BP: 151/57 mmHg (09/29 0712) Pulse Rate: 82 (09/29 0712)  Labs:  Recent Labs  12/29/12 1834  12/30/12 0419 12/30/12 0820 12/30/12 0955 12/30/12 1400  12/31/12 0830 12/31/12 0836 12/31/12 1905 01/01/13 0500  HGB 16.3*  --  14.4  --  15.0  --   --  14.7  --   --   --   HCT 46.2*  --  41.1  --  42.4  --   --  42.4  --   --   --   PLT 386  --  341  --  328  --   --  352  --   --   --   HEPARINUNFRC  --   --   --   --  0.33  --   < > 1.04*  --  0.65 0.72*  CREATININE 0.62  --  0.56  --   --   --   --  0.52  --   --   --   TROPONINI 0.33*  < >  --  1.07*  --  0.66*  --   --  <0.30  --   --   < > = values in this interval not displayed.  Estimated Creatinine Clearance: 65.7 ml/min (by C-G formula based on Cr of 0.52).   Medications:  Heparin @ 1300 units/hr  Assessment: 67yof continues on heparin for NSTEMI with plans for cath tomorrow. Heparin level this morning is slightly above goal at 0.72. No CBC today. No bleeding reported.  Goal of Therapy:  Heparin level 0.3-0.7 units/ml Monitor platelets by anticoagulation protocol: Yes   Plan:  1) Decrease heparin to 1200 units/hr 2) Heparin level, CBC in AM 3) Follow up after cath tomorrow   Fredrik Rigger 01/01/2013,8:20 AM

## 2013-01-01 NOTE — Progress Notes (Signed)
Patient ID: Brandy Copeland, female   DOB: May 04, 1945, 67 y.o.   MRN: [id number] Subjective:  Bad night with wheezing and dyspnea   Objective:  Vital Signs in the last 24 hours: BP 151/57  Pulse 82  Temp(Src) 97.9 F (36.6 C) (Oral)  Resp 22  Ht 5' 3.5" (1.613 m)  Wt 154 lb 8.7 oz (70.1 kg)  BMI 26.94 kg/m2  SpO2 92%  Physical Exam: Pleasant BF in NAD Lungs:  Diffuse wheezing bilaterally Cardiac:  Regular rhythm, normal S1 and S2, no S3 Abdomen:  Soft, nontender, no masses Extremities:  No edema present  Intake/Output from previous day: 09/28 0701 - 09/29 0700 In: 1585 [P.O.:1320; I.V.:265] Out: 2602 [Urine:2602]  Weight Filed Weights   12/30/12 0050  Weight: 154 lb 8.7 oz (70.1 kg)    Lab Results: Basic Metabolic Panel:  Recent Labs  40/98/11 0419 12/31/12 0830  NA 137 141  K 3.4* 3.5  CL 100 104  CO2 21 24  GLUCOSE 194* 116*  BUN 13 10  CREATININE 0.56 0.52   CBC:  Recent Labs  12/29/12 1834  12/30/12 0955 12/31/12 0830  WBC 12.3*  < > 8.4 13.1*  NEUTROABS 9.1*  --   --   --   HGB 16.3*  < > 15.0 14.7  HCT 46.2*  < > 42.4 42.4  MCV 95.5  < > 94.9 95.9  PLT 386  < > 328 352  < > = values in this interval not displayed. Cardiac Enzymes:  Recent Labs  12/30/12 0820 12/30/12 1400 12/31/12 0836  TROPONINI 1.07* 0.66* <0.30    . albuterol  2.5 mg Nebulization Q6H  . amLODipine  10 mg Oral Daily  . aspirin EC  325 mg Oral Daily  . cloNIDine  0.1 mg Oral BID  . ipratropium  0.5 mg Nebulization Q6H  . levofloxacin  500 mg Oral Daily  . methylPREDNISolone (SOLU-MEDROL) injection  125 mg Intravenous Q6H  . nicotine  14 mg Transdermal Daily  . simvastatin  10 mg Oral q1800    Telemetry: Sinus rhythm  Assessment/Plan:  1. NonSTEMI  likely due to demand ischemia-or significant anterior T wave inversions could represent Takasubo syndrome or significant anterior ischemia. She is still wheezing significantly so I would continue her heparin for  the time being Echo with fairly good EF 50-55%  Discussed cath with patient willing to proceed.  Will tentatively schedule for tomorrow 2. Significant exacerbation of COPD  Continue inhalers and high dose iv steroids.   3. Hypertensive heart disease 4. Hyperlipidemia  On statin       Charlton Haws   01/01/2013, 7:50 AM

## 2013-01-01 NOTE — Progress Notes (Signed)
Patient ID: Brandy Copeland, female   DOB: 04/08/1945, 67 y.o.   MRN: [id number] [facility] PROGRESS NOTE  Brandy Copeland WUJ:[id number] DOB: Jun 28, 1945 DOA: 12/29/2012 PCP: Cleopatra Cedar, NP  Assessment/Plan:   COPD exacerbation: Continues to have significant wheezing and coughing.  Continue with IV Solumedrol, and nebs.  NSTEMI: cardiology following.  Continue heparin. Considering cath in next few days. ECHO with EF 50-55%. Continue statin, ASA.   Hypertension: elevated this morning. Continue to monitor.  Continue amlodipine, clonidine, prn hydralazine.   Tobacco use disorder: plans to quit smoking as of this admission. Hyperglycemia: A1C normal at 5.7  Code Status: full Family Communication: spoke with patient at bedside Disposition Plan: may transfer to tele this afternoon if resp status remains stable.   Consultants:  cardiology  Procedures:  ECHO 9/28 EF 50-55%  Antibiotics:  levoquin started 9/26 for COPD exacerbation   HPI/Subjective: 67 year old woman with history of COPD, CAD, HTN presented 9/26 with shortness of breath and fever.  Now treating for COPD exacerbation and NSTEMI.  Sats have been stable on room air in the 90%'s.  She continues to cough and wheeze.  No further chest pain. Has been up and walking in the halls.  Objective: Filed Vitals:   01/01/13 0712  BP: 151/57  Pulse: 82  Temp: 97.9 F (36.6 C)  Resp: 22    Intake/Output Summary (Last 24 hours) at 01/01/13 0846 Last data filed at 01/01/13 9562  Gross per 24 hour  Intake 1590.9 ml  Output   2601 ml  Net -1010.1 ml   Filed Weights   12/30/12 0050  Weight: 70.1 kg (154 lb 8.7 oz)    Exam:   General:  Alret, oriented, no distress  Cardiovascular: distant rrr no mrg  Respiratory: diffuse wheezes and rhonchi, fair air movement, no increase in work of breathing, lots of coughing  Abdomen: soft, non tender, non distended, bs normal  Musculoskeletal: no edema  Data  Reviewed: Basic Metabolic Panel:  Recent Labs Lab 12/29/12 1834 12/30/12 0419 12/31/12 0830  NA 141 137 141  K 3.5 3.4* 3.5  CL 101 100 104  CO2 22 21 24   GLUCOSE 186* 194* 116*  BUN 10 13 10   CREATININE 0.62 0.56 0.52  CALCIUM 9.2 8.8 9.4   Liver Function Tests: No results found for this basename: AST, ALT, ALKPHOS, BILITOT, PROT, ALBUMIN,  in the last 168 hours No results found for this basename: LIPASE, AMYLASE,  in the last 168 hours No results found for this basename: AMMONIA,  in the last 168 hours CBC:  Recent Labs Lab 12/29/12 1834 12/30/12 0419 12/30/12 0955 12/31/12 0830  WBC 12.3* 6.8 8.4 13.1*  NEUTROABS 9.1*  --   --   --   HGB 16.3* 14.4 15.0 14.7  HCT 46.2* 41.1 42.4 42.4  MCV 95.5 95.1 94.9 95.9  PLT 386 341 328 352   Cardiac Enzymes:  Recent Labs Lab 12/29/12 1834 12/30/12 0200 12/30/12 0820 12/30/12 1400 12/31/12 0836  TROPONINI 0.33* 1.10* 1.07* 0.66* <0.30   BNP (last 3 results)  Recent Labs  12/29/12 1834  PROBNP 441.9*   CBG: No results found for this basename: GLUCAP,  in the last 168 hours  Recent Results (from the past 240 hour(s))  CULTURE, BLOOD (ROUTINE X 2)     Status: None   Collection Time    12/29/12 10:22 PM      Result Value Range Status   Specimen Description BLOOD RIGHT WRIST  Final   Special Requests BOTTLES DRAWN AEROBIC AND ANAEROBIC 5CC   Final   Culture  Setup Time     Final   Value: 12/30/2012 05:58     Performed at [facility]   Culture     Final   Value:        BLOOD CULTURE RECEIVED NO GROWTH TO DATE CULTURE WILL BE HELD FOR 5 DAYS BEFORE ISSUING A FINAL NEGATIVE REPORT     Performed at [facility]   Report Status PENDING   Incomplete  CULTURE, BLOOD (ROUTINE X 2)     Status: None   Collection Time    12/29/12 10:33 PM      Result Value Range Status   Specimen Description BLOOD RIGHT FOREARM   Final   Special Requests BOTTLES DRAWN AEROBIC AND ANAEROBIC 5CC   Final   Culture   Setup Time     Final   Value: 12/30/2012 05:57     Performed at [facility]   Culture     Final   Value:        BLOOD CULTURE RECEIVED NO GROWTH TO DATE CULTURE WILL BE HELD FOR 5 DAYS BEFORE ISSUING A FINAL NEGATIVE REPORT     Performed at [facility]   Report Status PENDING   Incomplete  MRSA PCR SCREENING     Status: None   Collection Time    12/30/12  1:15 AM      Result Value Range Status   MRSA by PCR NEGATIVE  NEGATIVE Final   Comment:            The GeneXpert MRSA Assay (FDA     approved for NASAL specimens     only), is one component of a     comprehensive MRSA colonization     surveillance program. It is not     intended to diagnose MRSA     infection nor to guide or     monitor treatment for     MRSA infections.     Studies: No results found.  Scheduled Meds: . albuterol  2.5 mg Nebulization Q6H  . amLODipine  10 mg Oral Daily  . aspirin EC  325 mg Oral Daily  . cloNIDine  0.1 mg Oral BID  . ipratropium  0.5 mg Nebulization Q6H  . levofloxacin  500 mg Oral Daily  . methylPREDNISolone (SOLU-MEDROL) injection  125 mg Intravenous Q6H  . nicotine  14 mg Transdermal Daily  . simvastatin  10 mg Oral q1800   Continuous Infusions: . heparin 1,200 Units/hr (01/01/13 1914)    Principal Problem:   COPD exacerbation Active Problems:   Hypertensive heart disease   Tobacco use disorder   Elevated troponin level   Sinus tachycardia   Hyperlipidemia    Time spent:   [facility]  [facility] Pager [phone] If 7PM-7AM, please contact night-coverage at [email or web address], password [facility] 01/01/2013, 8:46 AM  LOS: 3 days

## 2013-01-02 ENCOUNTER — Encounter (HOSPITAL_COMMUNITY)
Admission: EM | Disposition: A | Discharge status: Home / Self Care | Payer: Self-pay | Source: Home / Self Care | Attending: Internal Medicine

## 2013-01-02 DIAGNOSIS — I2510 Atherosclerotic heart disease of native coronary artery without angina pectoris: Secondary | ICD-10-CM

## 2013-01-02 HISTORY — PX: LEFT HEART CATHETERIZATION WITH CORONARY ANGIOGRAM: SHX5451

## 2013-01-02 LAB — BASIC METABOLIC PANEL WITH GFR
BUN: 9 mg/dL (ref 6–23)
CO2: 27 meq/L (ref 19–32)
Calcium: 9.1 mg/dL (ref 8.4–10.5)
Chloride: 102 meq/L (ref 96–112)
Creatinine, Ser: 0.54 mg/dL (ref 0.50–1.10)
GFR calc Af Amer: >90 mL/min
GFR calc non Af Amer: >90 mL/min
Glucose, Bld: 160 mg/dL — ABNORMAL HIGH (ref 70–99)
Potassium: 3.1 meq/L — ABNORMAL LOW (ref 3.5–5.1)
Sodium: 141 meq/L (ref 135–145)

## 2013-01-02 LAB — CBC
Hemoglobin: 14.3 g/dL (ref 12.0–15.0)
Platelets: 358 10*3/uL (ref 150–400)
RBC: 4.4 MIL/uL (ref 3.87–5.11)
WBC: 10.9 10*3/uL — ABNORMAL HIGH (ref 4.0–10.5)

## 2013-01-02 LAB — HEPARIN LEVEL (UNFRACTIONATED): Heparin Unfractionated: 0.5 [IU]/mL (ref 0.30–0.70)

## 2013-01-02 LAB — PROTIME-INR
INR: 0.99 (ref 0.00–1.49)
Prothrombin Time: 12.9 seconds (ref 11.6–15.2)

## 2013-01-02 SURGERY — LEFT HEART CATHETERIZATION WITH CORONARY ANGIOGRAM
Anesthesia: LOCAL

## 2013-01-02 MED ORDER — HEPARIN (PORCINE) IN NACL 100-0.45 UNIT/ML-% IJ SOLN
1050.0000 [IU]/h | INTRAMUSCULAR | Status: DC
  Administered 2013-01-02: 1050 [IU]/h via INTRAVENOUS
  Filled 2013-01-02: qty 250

## 2013-01-02 MED ORDER — SODIUM CHLORIDE 0.9 % IJ SOLN
3.0000 mL | Freq: Two times a day (BID) | INTRAMUSCULAR | Status: DC
  Administered 2013-01-02: 3 mL via INTRAVENOUS

## 2013-01-02 MED ORDER — MIDAZOLAM HCL 2 MG/2ML IJ SOLN
INTRAMUSCULAR | Status: AC
  Filled 2013-01-02: qty 2

## 2013-01-02 MED ORDER — SODIUM CHLORIDE 0.45 % IV SOLN: INTRAVENOUS | Status: AC

## 2013-01-02 MED ORDER — ONDANSETRON HCL 4 MG/2ML IJ SOLN: 4.0000 mg | Freq: Four times a day (QID) | INTRAMUSCULAR | Status: DC | PRN

## 2013-01-02 MED ORDER — LIDOCAINE HCL (PF) 1 % IJ SOLN
INTRAMUSCULAR | Status: AC
  Filled 2013-01-02: qty 30

## 2013-01-02 MED ORDER — SODIUM CHLORIDE 0.9 % IV SOLN: 250.0000 mL | INTRAVENOUS | Status: DC | PRN

## 2013-01-02 MED ORDER — SODIUM CHLORIDE 0.9 % IJ SOLN: 3.0000 mL | INTRAMUSCULAR | Status: DC | PRN

## 2013-01-02 MED ORDER — HEPARIN (PORCINE) IN NACL 2-0.9 UNIT/ML-% IJ SOLN: INTRAMUSCULAR | Status: DC

## 2013-01-02 MED ORDER — NITROGLYCERIN 0.2 MG/ML ON CALL CATH LAB
INTRAVENOUS | Status: AC
  Filled 2013-01-02: qty 1

## 2013-01-02 MED ORDER — VERAPAMIL HCL 2.5 MG/ML IV SOLN
INTRAVENOUS | Status: AC
  Filled 2013-01-02: qty 2

## 2013-01-02 MED ORDER — HEPARIN SODIUM (PORCINE) 1000 UNIT/ML IJ SOLN
INTRAMUSCULAR | Status: AC
  Filled 2013-01-02: qty 1

## 2013-01-02 MED ORDER — FENTANYL CITRATE 0.05 MG/ML IJ SOLN
INTRAMUSCULAR | Status: AC
  Filled 2013-01-02: qty 2

## 2013-01-02 MED ORDER — POTASSIUM CHLORIDE CRYS ER 20 MEQ PO TBCR: 40.0000 meq | EXTENDED_RELEASE_TABLET | Freq: Once | ORAL | Status: DC

## 2013-01-02 MED ORDER — ACETAMINOPHEN 325 MG PO TABS
650.0000 mg | ORAL_TABLET | ORAL | Status: DC | PRN
  Filled 2013-01-02: qty 2

## 2013-01-02 MED ORDER — HEPARIN (PORCINE) IN NACL 2-0.9 UNIT/ML-% IJ SOLN
INTRAMUSCULAR | Status: AC
  Filled 2013-01-02: qty 1000

## 2013-01-02 MED ORDER — ASPIRIN EC 325 MG PO TBEC: 325.0000 mg | DELAYED_RELEASE_TABLET | Freq: Every day | ORAL | Status: DC

## 2013-01-02 MED ORDER — OXYCODONE-ACETAMINOPHEN 5-325 MG PO TABS: 1.0000 | ORAL_TABLET | ORAL | Status: DC | PRN

## 2013-01-02 MED ORDER — HEPARIN (PORCINE) IN NACL 100-0.45 UNIT/ML-% IJ SOLN: 15.0000 [IU]/kg/h | INTRAMUSCULAR | Status: DC

## 2013-01-02 NOTE — Progress Notes (Signed)
Patient ID: Brandy Copeland, female   DOB: [birth year], 67 y.o.   MRN: [id number] [facility] PROGRESS NOTE  Brandy Copeland WUJ:[id number] DOB: [birth year] DOA: 12/29/2012 PCP: Brandy Cedar, NP  Assessment/Plan:   COPD exacerbation: Continues to have significant wheezing and coughing.  Continue with IV Solumedrol, and nebs.  She is not on supplemental 02, but does have coughing and wheezing episodes that are severe.  NSTEMI: Cath today with small/moderate lesions. Medical management for secondary risk reduction.  Heparin gtt stopped. ECHO with EF 50-55%. Continue statin, ASA.   Hypertension: controled  Continue amlodipine, clonidine, prn hydralazine.   Tobacco use disorder: plans to quit smoking as of this admission. Hyperglycemia: A1C normal at 5.7  Code Status: full Family Communication: spoke with patient at bedside Disposition Plan: may transfer to tele this afternoon if resp status remains stable.   Consultants:  cardiology  Procedures:  ECHO 9/28 EF 50-55%  Antibiotics:  levoquin started 9/26 for COPD exacerbation   HPI/Subjective: 67 year old woman with history of COPD, CAD, HTN presented 9/26 with shortness of breath and fever.  Now treating for COPD exacerbation and NSTEMI.  Sats have been stable on room air in the 90%'s.  She continues to cough and wheeze.  No further chest pain. Has been up and walking in the halls.  Objective: Filed Vitals:   01/02/13 1441  BP: 114/53  Pulse: 70  Temp: 97.7 F (36.5 C)  Resp: 18    Intake/Output Summary (Last 24 hours) at 01/02/13 1529 Last data filed at 01/02/13 1441  Gross per 24 hour  Intake   1416 ml  Output   1600 ml  Net   -184 ml   Filed Weights   12/30/12 0050 01/01/13 1658  Weight: 70.1 kg (154 lb 8.7 oz) 71.6 kg (157 lb 13.6 oz)    Exam:   General:  Alret, oriented, no distress  Cardiovascular: distant rrr no mrg  Respiratory: diffuse wheezes and rhonchi, fair air movement, no increase in work  of breathing, lots of coughing  Abdomen: soft, non tender, non distended, bs normal  Musculoskeletal: no edema  Data Reviewed: Basic Metabolic Panel:  Recent Labs Lab 12/29/12 1834 12/30/12 0419 12/31/12 0830 01/02/13 0445  NA 141 137 141 141  K 3.5 3.4* 3.5 3.1*  CL 101 100 104 102  CO2 22 21 24 27   GLUCOSE 186* 194* 116* 160*  BUN 10 13 10 9   CREATININE 0.62 0.56 0.52 0.54  CALCIUM 9.2 8.8 9.4 9.1   Liver Function Tests: No results found for this basename: AST, ALT, ALKPHOS, BILITOT, PROT, ALBUMIN,  in the last 168 hours No results found for this basename: LIPASE, AMYLASE,  in the last 168 hours No results found for this basename: AMMONIA,  in the last 168 hours CBC:  Recent Labs Lab 12/29/12 1834 12/30/12 0419 12/30/12 0955 12/31/12 0830 01/01/13 1142 01/02/13 0445  WBC 12.3* 6.8 8.4 13.1* 9.3 10.9*  NEUTROABS 9.1*  --   --   --   --   --   HGB 16.3* 14.4 15.0 14.7 13.8 14.3  HCT 46.2* 41.1 42.4 42.4 39.9 41.8  MCV 95.5 95.1 94.9 95.9 94.5 95.0  PLT 386 341 328 352 359 358   Cardiac Enzymes:  Recent Labs Lab 12/29/12 1834 12/30/12 0200 12/30/12 0820 12/30/12 1400 12/31/12 0836  TROPONINI 0.33* 1.10* 1.07* 0.66* <0.30   BNP (last 3 results)  Recent Labs  12/29/12 1834  PROBNP 441.9*   CBG: No results found  for this basename: GLUCAP,  in the last 168 hours  Recent Results (from the past 240 hour(s))  CULTURE, BLOOD (ROUTINE X 2)     Status: None   Collection Time    12/29/12 10:22 PM      Result Value Range Status   Specimen Description BLOOD RIGHT WRIST   Final   Special Requests BOTTLES DRAWN AEROBIC AND ANAEROBIC 5CC   Final   Culture  Setup Time     Final   Value: 12/30/2012 05:58     Performed at [facility]   Culture     Final   Value:        BLOOD CULTURE RECEIVED NO GROWTH TO DATE CULTURE WILL BE HELD FOR 5 DAYS BEFORE ISSUING A FINAL NEGATIVE REPORT     Performed at [facility]   Report Status PENDING    Incomplete  CULTURE, BLOOD (ROUTINE X 2)     Status: None   Collection Time    12/29/12 10:33 PM      Result Value Range Status   Specimen Description BLOOD RIGHT FOREARM   Final   Special Requests BOTTLES DRAWN AEROBIC AND ANAEROBIC 5CC   Final   Culture  Setup Time     Final   Value: 12/30/2012 05:57     Performed at [facility]   Culture     Final   Value:        BLOOD CULTURE RECEIVED NO GROWTH TO DATE CULTURE WILL BE HELD FOR 5 DAYS BEFORE ISSUING A FINAL NEGATIVE REPORT     Performed at [facility]   Report Status PENDING   Incomplete  MRSA PCR SCREENING     Status: None   Collection Time    12/30/12  1:15 AM      Result Value Range Status   MRSA by PCR NEGATIVE  NEGATIVE Final   Comment:            The GeneXpert MRSA Assay (FDA     approved for NASAL specimens     only), is one component of a     comprehensive MRSA colonization     surveillance program. It is not     intended to diagnose MRSA     infection nor to guide or     monitor treatment for     MRSA infections.     Studies: No results found.  Scheduled Meds: . albuterol  2.5 mg Nebulization TID  . amLODipine  10 mg Oral Daily  . aspirin EC  325 mg Oral Daily  . cloNIDine  0.1 mg Oral BID  . ipratropium  0.5 mg Nebulization TID  . levofloxacin  500 mg Oral Daily  . methylPREDNISolone (SOLU-MEDROL) injection  125 mg Intravenous Q6H  . nicotine  14 mg Transdermal Daily  . potassium chloride  40 mEq Oral Once  . simvastatin  10 mg Oral q1800  . sodium chloride  3 mL Intravenous Q12H   Continuous Infusions:    Principal Problem:   COPD exacerbation Active Problems:   Hypertensive heart disease   Tobacco use disorder   Elevated troponin level   Sinus tachycardia   Hyperlipidemia    Time spent:   [facility]  [facility] Pager [phone] If 7PM-7AM, please contact night-coverage at [email or web address], password [facility] 01/02/2013, 3:29 PM  LOS: 4 days

## 2013-01-02 NOTE — Progress Notes (Signed)
Patient ID: Brandy Copeland, female   DOB: [birth year], 67 y.o.   MRN: [id number] Films reviewed with Dr Allyson Sabal and [place] Both agreed that lesions are more moderate and initial  Trial of medical Rx warranted since she historically and this hospitalization with no chest pain.  Charlton Haws

## 2013-01-02 NOTE — CV Procedure (Signed)
   Cardiac Catheterization Procedure Note  Name: Brandy Copeland MRN: [id number] DOB: 11-21-1945  Procedure: Left Heart Cath, Selective Coronary Angiography, LV angiography  Indication:  SEMI duirng COPD exacerbation    Procedural Details: The right wrist was prepped, draped, and anesthetized with 1% lidocaine. Using the modified Seldinger technique, a 5 French sheath was introduced into the right radial artery. 3 mg of verapamil was administered through the sheath, weight-based unfractionated heparin was administered intravenously. Standard Judkins catheters were used for selective coronary angiography and left ventriculography. Catheter exchanges were performed over an exchange length guidewire. There were no immediate procedural complications. A TR band was used for radial hemostasis at the completion of the procedure.  The patient was transferred to the post catheterization recovery area for further monitoring.  Procedural Findings: Hemodynamics: AO  154 80 LV  152 15 mmHg  Coronary angiography: Coronary dominance: right  Left mainstem:  Calcified 30% mid  Left anterior descending (LAD): 40% proximal 60% mid and 70% distal   IM:  Large vessel  With bifurcation  70% tortuous disease in smaller superior branch  Left circumflex (LCx):  40% ostial  70% mid vessel disease at take off of two small OM brances  30% distal disease   Right coronary artery (RCA):  60% proximal,  60-70% mid 50% distal      PDA:  40% ostial  Left ventriculography: Left ventricular systolic function is normal, LVEF is estimated at 55-65%, there is no significant mitral regurgitation   Impression:  Will ask Dr [place] to review.  Mid RCA is tightest approachable lesion.  The mid circumflex and superior branch of IM would be  Difficult to stent.  May be that medical Rx is best to start since she has no chest pain and is severely limited by smoking and COPD  Charlton Haws 01/02/2013, 8:25 AM

## 2013-01-02 NOTE — Interval H&P Note (Signed)
History and Physical Interval Note:  01/02/2013 8:25 AM  Brandy Copeland  has presented today for surgery, with the diagnosis of cp  The various methods of treatment have been discussed with the patient and family. After consideration of risks, benefits and other options for treatment, the patient has consented to  Procedure(s): LEFT HEART CATHETERIZATION WITH CORONARY ANGIOGRAM (N/A) as a surgical intervention .  The patient's history has been reviewed, patient examined, no change in status, stable for surgery.  I have reviewed the patient's chart and labs.  Questions were answered to the patient's satisfaction.     Charlton Haws

## 2013-01-03 DIAGNOSIS — E785 Hyperlipidemia, unspecified: Secondary | ICD-10-CM

## 2013-01-03 LAB — CBC
HCT: 43.4 % (ref 36.0–46.0)
Hemoglobin: 15 g/dL (ref 12.0–15.0)
Platelets: 339 10*3/uL (ref 150–400)
RDW: 15.2 % (ref 11.5–15.5)
WBC: 10.4 10*3/uL (ref 4.0–10.5)

## 2013-01-03 LAB — BASIC METABOLIC PANEL
BUN: 13 mg/dL (ref 6–23)
Chloride: 104 mEq/L (ref 96–112)
GFR calc Af Amer: >90 mL/min (ref >90)
Glucose, Bld: 163 mg/dL — ABNORMAL HIGH (ref 70–99)
Potassium: 3.7 mEq/L (ref 3.5–5.1)

## 2013-01-03 MED ORDER — ISOSORBIDE MONONITRATE 15 MG HALF TABLET
15.0000 mg | ORAL_TABLET | Freq: Every day | ORAL | Status: DC
  Administered 2013-01-03 – 2013-01-04 (×2): 15 mg via ORAL
  Filled 2013-01-03 (×2): qty 1

## 2013-01-03 MED ORDER — CLOPIDOGREL BISULFATE 75 MG PO TABS
75.0000 mg | ORAL_TABLET | Freq: Every day | ORAL | Status: DC
  Administered 2013-01-03 – 2013-01-04 (×2): 75 mg via ORAL
  Filled 2013-01-03 (×2): qty 1

## 2013-01-03 MED ORDER — LISINOPRIL 10 MG PO TABS
10.0000 mg | ORAL_TABLET | Freq: Every day | ORAL | Status: DC
  Administered 2013-01-03 – 2013-01-04 (×2): 10 mg via ORAL
  Filled 2013-01-03 (×2): qty 1

## 2013-01-03 MED ORDER — PREDNISONE 50 MG PO TABS
50.0000 mg | ORAL_TABLET | Freq: Every day | ORAL | Status: DC
  Administered 2013-01-04: 50 mg via ORAL
  Filled 2013-01-03 (×2): qty 1

## 2013-01-03 NOTE — Progress Notes (Signed)
  Patient ID: Brandy Copeland, female   DOB: 12/06/1945, 67 y.o.   MRN: [id number] Subjective:  Wants to go home Looks great Less wheezing   Objective:  Vital Signs in the last 24 hours: BP 136/63  Pulse 72  Temp(Src) 97.9 F (36.6 C) (Oral)  Resp 15  Ht 5' 3.5" (1.613 m)  Wt 157 lb 13.6 oz (71.6 kg)  BMI 27.52 kg/m2  SpO2 94%  Physical Exam: Pleasant BF in NAD Lungs:  Mild expitory wheezing  Cardiac:  Regular rhythm, normal S1 and S2, no S3 Abdomen:  Soft, nontender, no masses Extremities:  No edema present Right Radial A   Intake/Output from previous day: 09/30 0701 - 10/01 0700 In: 726 [P.O.:720; I.V.:6] Out: -   Weight Filed Weights   12/30/12 0050 01/01/13 1658  Weight: 154 lb 8.7 oz (70.1 kg) 157 lb 13.6 oz (71.6 kg)    Lab Results: Basic Metabolic Panel:  Recent Labs  64/33/29 0445 01/03/13 0405  NA 141 143  K 3.1* 3.7  CL 102 104  CO2 27 28  GLUCOSE 160* 163*  BUN 9 13  CREATININE 0.54 0.60   CBC:  Recent Labs  01/02/13 0445 01/03/13 0405  WBC 10.9* 10.4  HGB 14.3 15.0  HCT 41.8 43.4  MCV 95.0 94.6  PLT 358 339   Cardiac Enzymes:  Recent Labs  12/31/12 0836  TROPONINI <0.30    . albuterol  2.5 mg Nebulization TID  . amLODipine  10 mg Oral Daily  . aspirin EC  325 mg Oral Daily  . cloNIDine  0.1 mg Oral BID  . ipratropium  0.5 mg Nebulization TID  . levofloxacin  500 mg Oral Daily  . methylPREDNISolone (SOLU-MEDROL) injection  125 mg Intravenous Q6H  . nicotine  14 mg Transdermal Daily  . potassium chloride  40 mEq Oral Once  . simvastatin  10 mg Oral q1800  . sodium chloride  3 mL Intravenous Q12H    Telemetry: Sinus rhythm  Assessment/Plan:  1. NonSTEMI  likely due to demand ischemia-or significant anterior T wave inversions could represent Takasubo syndrome or significant anterior ischemia. She is still wheezing significantly so I would continue her heparin for the time being Echo with fairly good EF 50-55%  Discussed  cath with patient willing to proceed.  Will tentatively schedule for tomorrow 2. Significant exacerbation of COPD  Change and d/c with po steroid taper   3. Hypertensive heart disease 4. Hyperlipidemia  On statin   Add plavix and imdur for CAD and d/c amlodipine   Charlton Haws   01/03/2013, 7:50 AM

## 2013-01-03 NOTE — Progress Notes (Signed)
Patient ID: Brandy Copeland, female   DOB: Feb 15, 1946, 67 y.o.   MRN: [id number] [facility] PROGRESS NOTE  Brandy Copeland [id number] DOB: [birth year] DOA: 12/29/2012 PCP: Cleopatra Cedar, NP  Assessment/Plan:   COPD exacerbation: Chest exam and patient comfort markedly improved. Dc IV solumedrol today and start PO prednisone. If still doing well tomorrow can dc home. Has not needed supplemental 02. On levaquin for 5 days for COPD exac, OK to dc tomorrow. This is her first hospitalization for COPD exacerbation.  She was not on any COPD meds at admission.  She will need an albuterol inhaler on discharge.  Will need pft's when this exacerbation is resolved.  Given the severity of this exacerbation she should be discharged on PO prednisone for at least the next week.  NSTEMI: Cath 9/30with small/moderate lesions. Medical management for secondary risk reduction. Continue statin, ASA, plavix, IMDUR. Start lisinopril 10 mg.   Hypertension: controled  Continue clonidine.   Tobacco use disorder: plans to quit smoking as of this admission. Hyperglycemia: A1C normal at 5.7  Code Status: full Family Communication: spoke with patient at bedside Disposition Plan: transition to oral steroids in preparation for discharge.   Consultants:  cardiology  Procedures:  ECHO 9/28 EF 50-55%  Cath 9/30 mild/moderate CAD  Antibiotics:  levoquin started 9/26 for COPD exacerbation>>10/2   HPI/Subjective: 67 year old woman with history of COPD, CAD, HTN presented 9/26 with shortness of breath and fever.  Now treating for COPD exacerbation and NSTEMI.  Sats have been stable on room air in the 90%'s.   No further chest pain. Has been up and walking in the halls. Cough and wheezing decreased today.  Objective: Filed Vitals:   01/03/13 1108  BP: 141/61  Pulse: 80  Temp:   Resp:     Intake/Output Summary (Last 24 hours) at 01/03/13 1119 Last data filed at 01/03/13 0956  Gross per 24 hour  Intake     846 ml  Output      0 ml  Net    846 ml   Filed Weights   12/30/12 0050 01/01/13 1658  Weight: 70.1 kg (154 lb 8.7 oz) 71.6 kg (157 lb 13.6 oz)    Exam:   General:  Alret, oriented, no distress  Cardiovascular: distant rrr no mrg  Respiratory: diffuse but decreased wheezes and rhonchi, fair air movement  Abdomen: soft, non tender, non distended, bs normal  Musculoskeletal: no edema  Data Reviewed: Basic Metabolic Panel:  Recent Labs Lab 12/29/12 1834 12/30/12 0419 12/31/12 0830 01/02/13 0445 01/03/13 0405  NA 141 137 141 141 143  K 3.5 3.4* 3.5 3.1* 3.7  CL 101 100 104 102 104  CO2 22 21 24 27 28   GLUCOSE 186* 194* 116* 160* 163*  BUN 10 13 10 9 13   CREATININE 0.62 0.56 0.52 0.54 0.60  CALCIUM 9.2 8.8 9.4 9.1 8.7   Liver Function Tests: No results found for this basename: AST, ALT, ALKPHOS, BILITOT, PROT, ALBUMIN,  in the last 168 hours No results found for this basename: LIPASE, AMYLASE,  in the last 168 hours No results found for this basename: AMMONIA,  in the last 168 hours CBC:  Recent Labs Lab 12/29/12 1834  12/30/12 0955 12/31/12 0830 01/01/13 1142 01/02/13 0445 01/03/13 0405  WBC 12.3*  < > 8.4 13.1* 9.3 10.9* 10.4  NEUTROABS 9.1*  --   --   --   --   --   --   HGB 16.3*  < >  15.0 14.7 13.8 14.3 15.0  HCT 46.2*  < > 42.4 42.4 39.9 41.8 43.4  MCV 95.5  < > 94.9 95.9 94.5 95.0 94.6  PLT 386  < > 328 352 359 358 339  < > = values in this interval not displayed. Cardiac Enzymes:  Recent Labs Lab 12/29/12 1834 12/30/12 0200 12/30/12 0820 12/30/12 1400 12/31/12 0836  TROPONINI 0.33* 1.10* 1.07* 0.66* <0.30   BNP (last 3 results)  Recent Labs  12/29/12 1834  PROBNP 441.9*   CBG: No results found for this basename: GLUCAP,  in the last 168 hours  Recent Results (from the past 240 hour(s))  CULTURE, BLOOD (ROUTINE X 2)     Status: None   Collection Time    12/29/12 10:22 PM      Result Value Range Status   Specimen Description  BLOOD RIGHT WRIST   Final   Special Requests BOTTLES DRAWN AEROBIC AND ANAEROBIC 5CC   Final   Culture  Setup Time     Final   Value: 12/30/2012 05:58     Performed at [facility]   Culture     Final   Value:        BLOOD CULTURE RECEIVED NO GROWTH TO DATE CULTURE WILL BE HELD FOR 5 DAYS BEFORE ISSUING A FINAL NEGATIVE REPORT     Performed at [facility]   Report Status PENDING   Incomplete  CULTURE, BLOOD (ROUTINE X 2)     Status: None   Collection Time    12/29/12 10:33 PM      Result Value Range Status   Specimen Description BLOOD RIGHT FOREARM   Final   Special Requests BOTTLES DRAWN AEROBIC AND ANAEROBIC 5CC   Final   Culture  Setup Time     Final   Value: 12/30/2012 05:57     Performed at [facility]   Culture     Final   Value:        BLOOD CULTURE RECEIVED NO GROWTH TO DATE CULTURE WILL BE HELD FOR 5 DAYS BEFORE ISSUING A FINAL NEGATIVE REPORT     Performed at [facility]   Report Status PENDING   Incomplete  MRSA PCR SCREENING     Status: None   Collection Time    12/30/12  1:15 AM      Result Value Range Status   MRSA by PCR NEGATIVE  NEGATIVE Final   Comment:            The GeneXpert MRSA Assay (FDA     approved for NASAL specimens     only), is one component of a     comprehensive MRSA colonization     surveillance program. It is not     intended to diagnose MRSA     infection nor to guide or     monitor treatment for     MRSA infections.     Studies: No results found.  Scheduled Meds: . albuterol  2.5 mg Nebulization TID  . aspirin EC  325 mg Oral Daily  . cloNIDine  0.1 mg Oral BID  . clopidogrel  75 mg Oral Q breakfast  . ipratropium  0.5 mg Nebulization TID  . isosorbide mononitrate  15 mg Oral Daily  . levofloxacin  500 mg Oral Daily  . methylPREDNISolone (SOLU-MEDROL) injection  125 mg Intravenous Q6H  . nicotine  14 mg Transdermal Daily  . potassium chloride  40 mEq Oral Once  . simvastatin  10 mg Oral q1800   . sodium chloride  3 mL Intravenous Q12H   Continuous Infusions:    Principal Problem:   COPD exacerbation Active Problems:   Hypertensive heart disease   Tobacco use disorder   Elevated troponin level   Sinus tachycardia   Hyperlipidemia    Time spent:   [facility]  [facility] Pager [phone] If 7PM-7AM, please contact night-coverage at [email or web address], password [facility] 01/03/2013, 11:19 AM  LOS: 5 days

## 2013-01-04 LAB — BASIC METABOLIC PANEL
BUN: 14 mg/dL (ref 6–23)
CO2: 31 mEq/L (ref 19–32)
Chloride: 99 mEq/L (ref 96–112)
Creatinine, Ser: 0.67 mg/dL (ref 0.50–1.10)
GFR calc non Af Amer: 90 mL/min — ABNORMAL LOW (ref >90)
Glucose, Bld: 111 mg/dL — ABNORMAL HIGH (ref 70–99)
Potassium: 3.5 mEq/L (ref 3.5–5.1)

## 2013-01-04 LAB — CBC
HCT: 42.5 % (ref 36.0–46.0)
Hemoglobin: 14.7 g/dL (ref 12.0–15.0)
MCH: 32.7 pg (ref 26.0–34.0)
MCHC: 34.6 g/dL (ref 30.0–36.0)

## 2013-01-04 MED ORDER — ALBUTEROL SULFATE (5 MG/ML) 0.5% IN NEBU: 2.5000 mg | INHALATION_SOLUTION | RESPIRATORY_TRACT | Status: DC | PRN

## 2013-01-04 MED ORDER — CLOPIDOGREL BISULFATE 75 MG PO TABS: 75.0000 mg | ORAL_TABLET | Freq: Every day | ORAL | Status: DC

## 2013-01-04 MED ORDER — PREDNISONE 10 MG PO TABS: ORAL_TABLET | ORAL | Status: DC

## 2013-01-04 MED ORDER — LISINOPRIL 10 MG PO TABS: 10.0000 mg | ORAL_TABLET | Freq: Every day | ORAL | Status: DC

## 2013-01-04 MED ORDER — ISOSORBIDE MONONITRATE 15 MG HALF TABLET: 15.0000 mg | ORAL_TABLET | Freq: Every day | ORAL | Status: DC

## 2013-01-04 NOTE — Discharge Summary (Signed)
Physician Discharge Summary  Brandy Copeland [id number] DOB: [birth year] DOA: 12/29/2012  PCP: Cleopatra Cedar, NP  Admit date: 12/29/2012 Discharge date: 01/04/2013  Time spent: 50* minutes  Recommendations for Outpatient Follow-up:  1. *Cardiology on 01/17/2013  Discharge Diagnoses:  Principal Problem:   COPD exacerbation Active Problems:   Hypertensive heart disease   Tobacco use disorder   Elevated troponin level   Sinus tachycardia   Hyperlipidemia   Discharge Condition: Stable  Diet recommendation: Low salt diet  Filed Weights   12/30/12 0050 01/01/13 1658  Weight: 70.1 kg (154 lb 8.7 oz) 71.6 kg (157 lb 13.6 oz)    History of present illness:  67 y.o. female with a history of COPD and CAD who presents to the ED with complaints of worsening SOB and Chest tightness X 2 days . She reports having increased cough productive of whitish mucus, and she denies having any fevers or chills. She denies having chest pain. In the ED she was found to have hypoxemia and was placed on BIPAP and she began to improve on BIPAP, and was referred for medical admission. She was found to have a temperature of 100.3 in the ED and was placed on IV Levaquin.    Hospital Course:   COPD exacerbation:  Patient was started on IV Solu-Medrol and nebulizer treatments along with supplemental oxygen. She improved with steroids, patient also was on IV Levaquin for 5 days. At this time patient's breathing is much better she'll be discharged on prednisone taper along with albuterol nebulizer when necessary. She will followup with PCP. And may require outpatient PFTs.  Non-STEMI Patient came with shortness of breath, initially her troponin was 0.3 which went up to 1.0. Cardiology was consulted for elevated troponin, patient underwent cardiac catheterization which showed Mid RCA is tightest approachable lesion. The mid circumflex and superior branch of IM would be  Difficult to stent. May be that medical Rx  is best to start since she has no chest pain and is severely limited by smoking and COPD  At this time patient will be treated with Imdur and Plavix.  Hypertension  Patient blood pressure is in the 90s in the hospital. We'll resume HCTZ 25 mg daily, lisinopril 10 mg by mouth daily has been added. I will discontinue Catapres 0.1 twice a day, as patient's blood pressure is low and she did not take this medication for 30 days prior to admission. Also continue amlodipine 10 mg by mouth daily. She will need to check her blood pressure at home and also followup with PCP to make sure the blood pressure remained stable.       Procedures:  Cardiac catheterization  Consultations:  Cardiology  Discharge Exam: Filed Vitals:   01/04/13 1505  BP: 94/51  Pulse: 77  Temp: 97.5 F (36.4 C)  Resp: 20    General: Appears in no acute distress Cardiovascular: S1-S2 regular Respiratory: Clear bilaterally Abdomen: Soft nontender Extremities: No edema  Discharge Instructions  Discharge Orders   Future Appointments Provider Department Dept Phone   01/17/2013 9:50 AM Beatrice Lecher, PA-C [facility] Office [phone]   Future Orders Complete By Expires   Diet - low sodium heart healthy  As directed    Increase activity slowly  As directed        Medication List    STOP taking these medications       diclofenac 50 MG EC tablet  Commonly known as:  VOLTAREN  TAKE these medications       albuterol (5 MG/ML) 0.5% nebulizer solution  Commonly known as:  PROVENTIL  Take 0.5 mLs (2.5 mg total) by nebulization every 4 (four) hours as needed for wheezing or shortness of breath.     amLODipine 10 MG tablet  Commonly known as:  NORVASC  Take 10 mg by mouth daily.     clopidogrel 75 MG tablet  Commonly known as:  PLAVIX  Take 1 tablet (75 mg total) by mouth daily with breakfast.     hydrochlorothiazide 25 MG tablet  Commonly known as:  HYDRODIURIL  Take 25 mg by  mouth every morning.     isosorbide mononitrate 15 mg Tb24 24 hr tablet  Commonly known as:  IMDUR  Take 0.5 tablets (15 mg total) by mouth daily.     lisinopril 10 MG tablet  Commonly known as:  PRINIVIL,ZESTRIL  Take 1 tablet (10 mg total) by mouth daily.     lovastatin 20 MG tablet  Commonly known as:  MEVACOR  Take 20 mg by mouth daily.     predniSONE 10 MG tablet  Commonly known as:  DELTASONE  - Prednisone 40 mg po daily x 1 day then  - Prednisone 30 mg po daily x 1 day then  - Prednisone 20 mg po daily x 1 day then  - Prednisone 10 mg daily x 1 day then stop...       No Known Allergies     Follow-up Information   Follow up with Cleopatra Cedar, NP. Schedule an appointment as soon as possible for a visit in 1 week.   Specialty:  Nurse Practitioner   Contact information:   [facility] [address] [place] [address] [phone]       Follow up with Tereso Newcomer, PA-C On 01/17/2013. (See for Dr. Eden Emms at 9:50 am)    Specialty:  Physician Assistant   Contact information:   [address] [place] [address] [phone]        The results of significant diagnostics from this hospitalization (including imaging, microbiology, ancillary and laboratory) are listed below for reference.    Significant Diagnostic Studies: Dg Chest Portable 1 View  12/29/2012   CLINICAL DATA:  Respiratory distress  EXAM: PORTABLE CHEST - 1 VIEW  COMPARISON:  [id number]  FINDINGS: The heart size and mediastinal contours are within normal limits. The lungs are hyperexpanded but clear. The visualized skeletal structures are demineralized but otherwise unremarkable.  IMPRESSION: No acute cardiopulmonary disease   Electronically Signed   By: Amie Portland   On: 12/29/2012 19:04    Microbiology: Recent Results (from the past 240 hour(s))  CULTURE, BLOOD (ROUTINE X 2)     Status: None   Collection Time    12/29/12 10:22 PM      Result Value  Range Status   Specimen Description BLOOD RIGHT WRIST   Final   Special Requests BOTTLES DRAWN AEROBIC AND ANAEROBIC 5CC   Final   Culture  Setup Time     Final   Value: 12/30/2012 05:58     Performed at [facility]   Culture     Final   Value:        BLOOD CULTURE RECEIVED NO GROWTH TO DATE CULTURE WILL BE HELD FOR 5 DAYS BEFORE ISSUING A FINAL NEGATIVE REPORT     Performed at [facility]   Report Status PENDING   Incomplete  CULTURE, BLOOD (ROUTINE X 2)     Status: None   Collection Time    12/29/12 10:33 PM      Result Value Range Status   Specimen Description BLOOD RIGHT FOREARM   Final   Special Requests BOTTLES DRAWN AEROBIC AND ANAEROBIC 5CC   Final   Culture  Setup Time     Final   Value: 12/30/2012 05:57     Performed at [facility]   Culture     Final   Value:        BLOOD CULTURE RECEIVED NO GROWTH TO DATE CULTURE WILL BE HELD FOR 5 DAYS BEFORE ISSUING A FINAL NEGATIVE REPORT     Performed at [facility]   Report Status PENDING   Incomplete  MRSA PCR SCREENING     Status: None   Collection Time    12/30/12  1:15 AM      Result Value Range Status   MRSA by PCR NEGATIVE  NEGATIVE Final   Comment:            The GeneXpert MRSA Assay (FDA     approved for NASAL specimens     only), is one component of a     comprehensive MRSA colonization     surveillance program. It is not     intended to diagnose MRSA     infection nor to guide or     monitor treatment for     MRSA infections.     Labs: Basic Metabolic Panel:  Recent Labs Lab 12/30/12 0419 12/31/12 0830 01/02/13 0445 01/03/13 0405 01/04/13 0540  NA 137 141 141 143 141  K 3.4* 3.5 3.1* 3.7 3.5  CL 100 104 102 104 99  CO2 21 24 27 28 31   GLUCOSE 194* 116* 160* 163* 111*  BUN 13 10 9 13 14   CREATININE 0.56 0.52 0.54 0.60 0.67  CALCIUM 8.8 9.4 9.1 8.7 8.7   Liver Function Tests: No results found for this basename: AST, ALT, ALKPHOS, BILITOT, PROT, ALBUMIN,  in the  last 168 hours No results found for this basename: LIPASE, AMYLASE,  in the last 168 hours No results found for this basename: AMMONIA,  in the last 168 hours CBC:  Recent Labs Lab 12/29/12 1834  12/31/12 0830 01/01/13 1142 01/02/13 0445 01/03/13 0405 01/04/13 0540  WBC 12.3*  < > 13.1* 9.3 10.9* 10.4 11.5*  NEUTROABS 9.1*  --   --   --   --   --   --   HGB 16.3*  < > 14.7 13.8 14.3 15.0 14.7  HCT 46.2*  < > 42.4 39.9 41.8 43.4 42.5  MCV 95.5  < > 95.9 94.5 95.0 94.6 94.7  PLT 386  < > 352 359 358 339 321  < > = values in this interval not displayed. Cardiac Enzymes:  Recent Labs Lab 12/29/12 1834 12/30/12 0200 12/30/12 0820 12/30/12 1400 12/31/12 0836  TROPONINI 0.33* 1.10* 1.07* 0.66* <0.30   BNP: BNP (last 3 results)  Recent Labs  12/29/12 1834  PROBNP 441.9*   CBG: No results found for this basename: GLUCAP,  in the last 168 hours     Signed:  Aulden Calise S  [facility] 01/04/2013, 4:48 PM

## 2013-01-05 LAB — CULTURE, BLOOD (ROUTINE X 2)
Culture: NO GROWTH
Culture: NO GROWTH

## 2013-01-17 ENCOUNTER — Encounter: Payer: Medicare Other | Admitting: Physician Assistant

## 2013-01-29 ENCOUNTER — Ambulatory Visit (INDEPENDENT_AMBULATORY_CARE_PROVIDER_SITE_OTHER): Payer: Medicare Other | Admitting: Physician Assistant

## 2013-01-29 ENCOUNTER — Encounter: Payer: Self-pay | Admitting: Physician Assistant

## 2013-01-29 VITALS — BP 198/100 | HR 98 | Wt 149.0 lb

## 2013-01-29 DIAGNOSIS — I2510 Atherosclerotic heart disease of native coronary artery without angina pectoris: Secondary | ICD-10-CM

## 2013-01-29 DIAGNOSIS — E785 Hyperlipidemia, unspecified: Secondary | ICD-10-CM

## 2013-01-29 DIAGNOSIS — I10 Essential (primary) hypertension: Secondary | ICD-10-CM

## 2013-01-29 DIAGNOSIS — J449 Chronic obstructive pulmonary disease, unspecified: Secondary | ICD-10-CM

## 2013-01-29 DIAGNOSIS — I214 Non-ST elevation (NSTEMI) myocardial infarction: Secondary | ICD-10-CM

## 2013-01-29 MED ORDER — AMLODIPINE BESYLATE 10 MG PO TABS: 10.0000 mg | ORAL_TABLET | Freq: Every day | ORAL | Status: DC

## 2013-01-29 MED ORDER — LOVASTATIN 20 MG PO TABS: 20.0000 mg | ORAL_TABLET | Freq: Every day | ORAL | Status: DC

## 2013-01-29 MED ORDER — ASPIRIN EC 81 MG PO TBEC: 81.0000 mg | DELAYED_RELEASE_TABLET | Freq: Every day | ORAL | Status: AC

## 2013-01-29 MED ORDER — HYDROCHLOROTHIAZIDE 25 MG PO TABS: 25.0000 mg | ORAL_TABLET | Freq: Every morning | ORAL | Status: DC

## 2013-01-29 NOTE — Patient Instructions (Signed)
Start on Aspirin 81 mg daily. Start Amlodipine 10 mg daily. Start Hydrochlorothiazide 25 mg daily. Start lovastatin 20 mg daily at bedtime.  You can use Mucinex as needed for cough (as directed on package).  DO NOT TAKE Mucinex-D. You can use Tylenol as directed for pain.  Schedule follow up with Tereso Newcomer, PA-C in 1 week. Schedule labs in 1 week:  BMET (401.1).  Call 1-800-QUIT-NOW [phone]) for help with quitting smoking.

## 2013-01-29 NOTE — Progress Notes (Signed)
[address] [place], [place]  [address] Phone: [phone] Fax:  [phone]  Date:  01/29/2013   ID:  Brandy Copeland, DOB 07-15-1945, MRN [id number]  PCP:  Cleopatra Cedar, NP  Cardiologist:  Dr. Charlton Haws     History of Present Illness: Brandy Copeland is a 67 y.o. female who returns for follow up after a recent admission to the hospital.   She has a history of COPD, HTN and reported CAD. She was admitted 9/26-10/2 with acute exacerbation of COPD. She presented to the hospital with dyspnea and hypoxia. She was placed on BiPAP. She was tx with a combination of steroids, nebulizer therapy and antibiotics. Her breathing overall improved and she was continued on a steroid taper at discharge. CEs were elevated ruling her in for a non-STEMI.  This was felt to be from demand ischemia.  She was seen by cardiology.  Echo (12/31/12):  Severe LVH, EF 50-55%, normal wall motion, grade 2 diastolic dysfunction, trivial TR.  Cardiac catheterization was recommended. LHC (01/02/13): Mid left main 30, proximal LAD 40, mid LAD 60, distal LAD 70, small superior branch of the IM 70, ostial circumflex 40, mid circumflex 70, distal circumflex 30, proximal RCA 60, mid RCA 60-70, distal RCA 50, ostial PDA 40, EF 55-65%. Films were reviewed with interventional colleagues in overall incision was made to pursue medical therapy.   Since discharge, she notes significant fatigue and decreased exercise tolerance. She also notes significant dyspnea with exertion. She describes NYHA class III symptoms. She sleeps on 2 pillows chronically. She denies PND. She denies edema. She denies chest discomfort. She denies syncope.  Labs (10/14):   K 3.5, creatinine 0.67, Hgb 14.7  Wt Readings from Last 3 Encounters:  01/29/13 149 lb (67.586 kg)  01/01/13 157 lb 13.6 oz (71.6 kg)  01/01/13 157 lb 13.6 oz (71.6 kg)     Past Medical History  Diagnosis Date  . Arthritis   . Knee pain   . Hip pain   . Hypertension     . Kidney stones   . Irregular heart beat     Current Outpatient Prescriptions  Medication Sig Dispense Refill  . albuterol (PROVENTIL) (5 MG/ML) 0.5% nebulizer solution Take 0.5 mLs (2.5 mg total) by nebulization every 4 (four) hours as needed for wheezing or shortness of breath.  20 mL  12  . clopidogrel (PLAVIX) 75 MG tablet Take 1 tablet (75 mg total) by mouth daily with breakfast.  30 tablet  2  . isosorbide mononitrate (IMDUR) 15 mg TB24 24 hr tablet Take 0.5 tablets (15 mg total) by mouth daily.  30 tablet  30  . lisinopril (PRINIVIL,ZESTRIL) 10 MG tablet Take 1 tablet (10 mg total) by mouth daily.  30 tablet  2  . amLODipine (NORVASC) 10 MG tablet Take 10 mg by mouth daily.        . hydrochlorothiazide (HYDRODIURIL) 25 MG tablet Take 25 mg by mouth every morning.       No current facility-administered medications for this visit.    Allergies:   No Known Allergies  Social History:  The patient  reports that she has been smoking Cigarettes.  She has been smoking about 0.50 packs per day. She does not have any smokeless tobacco history on file. She reports that she does not drink alcohol.   Family History:  The patient's family history includes Breast cancer in her mother; Cancer in her mother; Diabetes in her father; Heart disease in  her father; Hyperlipidemia in her father; Hypertension in her father.   ROS:  Please see the history of present illness.   She has a chronic cough productive of large amounts of sputum. She denies fevers, chills. She denies melena, hematochezia.   All other systems reviewed and negative.   PHYSICAL EXAM: VS:  BP 198/100  Pulse 98  Wt 149 lb (67.586 kg)  BMI 25.98 kg/m2 Well nourished, well developed, in no acute distress HEENT: normal Neck: no JVD at 90 Cardiac:  normal S1, S2; RRR; no murmur Lungs:  Decreased breath sounds bilaterally, no wheezing, rhonchi or rales Abd: soft, nontender, no hepatomegaly Ext: no edema; right wrist without  hematoma or mass  Skin: warm and dry Neuro:  CNs 2-12 intact, no focal abnormalities noted  EKG:  NSR, HR 98, normal axis, septal Q waves     ASSESSMENT AND PLAN:  1. CAD: She had a recent non-STEMI in the setting of COPD exacerbation. This is likely all demand ischemia. She had scattered moderate disease that is being treated medically. She does note decreased exercise tolerance and dyspnea of exertion. This is likely related to a combination of COPD exacerbation and uncontrolled hypertension in the setting of CAD. Continue aspirin, Plavix, statin, nitrates. 2. Hypertension: Uncontrolled. She is out of amlodipine and hydrochlorothiazide. I will refill her amlodipine and hydrochlorothiazide. Check a basic metabolic panel in one week. She will be brought back for close followup. 3. Hyperlipidemia: She is out of her lovastatin. This will be refilled. 4. COPD: She will followup with her PCP. She can use over-the-counter Mucinex as needed for cough. 5. Tobacco Abuse: I have recommended cessation. 6. Disposition: Follow up with me in one week.  Signed, Tereso Newcomer, PA-C  01/29/2013 11:22 AM

## 2013-02-08 ENCOUNTER — Ambulatory Visit (INDEPENDENT_AMBULATORY_CARE_PROVIDER_SITE_OTHER): Payer: Medicare Other | Admitting: Physician Assistant

## 2013-02-08 ENCOUNTER — Other Ambulatory Visit: Payer: Medicare Other

## 2013-02-08 ENCOUNTER — Encounter: Payer: Self-pay | Admitting: Physician Assistant

## 2013-02-08 VITALS — BP 132/60 | HR 90 | Ht 63.0 in | Wt 144.0 lb

## 2013-02-08 DIAGNOSIS — E785 Hyperlipidemia, unspecified: Secondary | ICD-10-CM

## 2013-02-08 DIAGNOSIS — J449 Chronic obstructive pulmonary disease, unspecified: Secondary | ICD-10-CM

## 2013-02-08 DIAGNOSIS — I2510 Atherosclerotic heart disease of native coronary artery without angina pectoris: Secondary | ICD-10-CM

## 2013-02-08 DIAGNOSIS — I214 Non-ST elevation (NSTEMI) myocardial infarction: Secondary | ICD-10-CM

## 2013-02-08 DIAGNOSIS — I10 Essential (primary) hypertension: Secondary | ICD-10-CM

## 2013-02-08 DIAGNOSIS — F17200 Nicotine dependence, unspecified, uncomplicated: Secondary | ICD-10-CM

## 2013-02-08 LAB — BASIC METABOLIC PANEL
BUN: 12 mg/dL (ref 6–23)
CO2: 29 mEq/L (ref 19–32)
Chloride: 81 mEq/L — ABNORMAL LOW (ref 96–112)
Creatinine, Ser: 0.7 mg/dL (ref 0.4–1.2)
Glucose, Bld: 114 mg/dL — ABNORMAL HIGH (ref 70–99)

## 2013-02-08 MED ORDER — CLOPIDOGREL BISULFATE 75 MG PO TABS: 75.0000 mg | ORAL_TABLET | Freq: Every day | ORAL | Status: DC

## 2013-02-08 MED ORDER — LISINOPRIL 10 MG PO TABS: 10.0000 mg | ORAL_TABLET | Freq: Every day | ORAL | Status: DC

## 2013-02-08 NOTE — Progress Notes (Signed)
[address] [place], [place]  [address] Phone: [phone] Fax:  [phone]  Date:  02/08/2013   ID:  Brandy Copeland, DOB 09/08/1945, MRN [id number]  PCP:  Elvina Sidle, MD  Cardiologist:  Dr. Charlton Haws     History of Present Illness: Brandy Copeland is a 67 y.o. female with a hx of COPD, HTN and reported CAD. She was admitted 12/2012 with acute exacerbation of COPD. She required BiPAP. CEs were elevated ruling her in for a non-STEMI.  This was felt to be from demand ischemia.  She was seen by cardiology.  Echo (12/31/12):  Severe LVH, EF 50-55%, normal wall motion, grade 2 diastolic dysfunction, trivial TR.  LHC (01/02/13): mLM 30, pLAD 40, mLAD 60, dLAD 70, small superior branch of the IM 70, oCFX 40, mCFX 70, dCFX 30, pRCA 60, mRCA 60-70, dRCA 50, oPDA 40, EF 55-65%. Medical therapy recommended.   I saw her 01/29/13. She continued to exhibit decreased exercise tolerance and dyspnea on exertion. This was felt to be related to a combination of COPD exacerbation and uncontrolled hypertension in the setting of CAD. She was out of her medications. I refilled her amlodipine and hydrochlorothiazide and brought her back for close follow up on her blood pressure.  She is doing well. Breathing is better.  No chest pain.  No syncope. She continues to have a significant cough.  She sleeps on 1-2 pillows.  No LE edema.   Recent Labs: 12/29/2012: Pro B Natriuretic peptide (BNP) 441.9*  01/04/2013: Creatinine 0.67; Hemoglobin 14.7; Potassium 3.5   Wt Readings from Last 3 Encounters:  02/08/13 144 lb (65.318 kg)  01/29/13 149 lb (67.586 kg)  01/01/13 157 lb 13.6 oz (71.6 kg)     Past Medical History  Diagnosis Date  . Arthritis   . Hypertension   . Kidney stones   . Irregular heart beat   . Coronary artery disease     a. NSTEMI 2/2 demand ischemia 12/2012 in setting of COPD exacerbation => LHC (01/02/13): mLM 30, pLAD 40, mLAD 60, dLAD 70, small superior branch of the IM 70,  oCFX 40, mCFX 70, dCFX 30, pRCA 60, mRCA 60-70, dRCA 50, oPDA 40, EF 55-65%. Medical therapy recommended.   [name] Hx of echocardiogram     a. Echo (12/31/12):  Severe LVH, EF 50-55%, normal wall motion, grade 2 diastolic dysfunction, trivial TR  . COPD (chronic obstructive pulmonary disease)   . Hyperlipidemia     Current Outpatient Prescriptions  Medication Sig Dispense Refill  . albuterol (PROVENTIL) (5 MG/ML) 0.5% nebulizer solution Take 0.5 mLs (2.5 mg total) by nebulization every 4 (four) hours as needed for wheezing or shortness of breath.  20 mL  12  . amLODipine (NORVASC) 10 MG tablet Take 1 tablet (10 mg total) by mouth daily.  30 tablet  11  . aspirin EC 81 MG tablet Take 1 tablet (81 mg total) by mouth daily.  30 tablet  11  . clopidogrel (PLAVIX) 75 MG tablet Take 1 tablet (75 mg total) by mouth daily with breakfast.  30 tablet  11  . hydrochlorothiazide (HYDRODIURIL) 25 MG tablet Take 1 tablet (25 mg total) by mouth every morning.  30 tablet  11  . isosorbide mononitrate (IMDUR) 15 mg TB24 24 hr tablet Take 0.5 tablets (15 mg total) by mouth daily.  30 tablet  30  . lisinopril (PRINIVIL,ZESTRIL) 10 MG tablet Take 1 tablet (10 mg total) by mouth daily.  30 tablet  11  .  lovastatin (MEVACOR) 20 MG tablet Take 1 tablet (20 mg total) by mouth at bedtime.  30 tablet  11   No current facility-administered medications for this visit.    Allergies:   No Known Allergies  Social History:  The patient  reports that she has been smoking Cigarettes.  She has been smoking about 0.50 packs per day. She does not have any smokeless tobacco history on file. She reports that she does not drink alcohol.   Family History:  The patient's family history includes Breast cancer in her mother; Cancer in her mother; Diabetes in her father; Heart disease in her father; Hyperlipidemia in her father; Hypertension in her father.   ROS:  Please see the history of present illness.     All other systems reviewed  and negative.   PHYSICAL EXAM: VS:  BP 132/60  Pulse 90  Ht 5\' 3"  (1.6 m)  Wt 144 lb (65.318 kg)  BMI 25.51 kg/m2 Well nourished, well developed, in no acute distress HEENT: normal Neck: no JVD at 90 Cardiac:  normal S1, S2; RRR; no murmur Lungs:  Decreased breath sounds bilaterally, no wheezing, rhonchi or rales Abd: soft, nontender, no hepatomegaly Ext: no edema  Skin: warm and dry Neuro:  CNs 2-12 intact, no focal abnormalities noted  EKG:  NSR, HR 90, LAD, septal Q waves, no ST changes    ASSESSMENT AND PLAN:  1. CAD:  No angina.  Continue aspirin, Plavix, statin, nitrates. 2. Hypertension:  Better controlled.  Continue current Rx.  3. Hyperlipidemia:  Continue statin.  Check Lipids and LFTs in 6 weeks.   4. COPD:  F/u with PCP for management.  I have suggested she inquire about Spiriva.  5. Tobacco Abuse:  I have recommended cessation. 6. Disposition: Follow up with Dr. Charlton Haws in 3 mos.  Signed, Tereso Newcomer, PA-C  02/08/2013 12:43 PM

## 2013-02-08 NOTE — Patient Instructions (Addendum)
Ask your primary care provider about Spiriva to help you with your cough from COPD. Call 1-800-QUIT-NOW (5105053304) for help with quitting smoking.   Your physician recommends that you continue on your current medications as directed. Please refer to the Current Medication list given to you today. Refilled plavix and lisinopril today  Your physician recommends that you have lab work today bmet  Your physician recommends that you return for lab work in: 6 weeks for lfts/lipid   Your physician recommends that you schedule a follow-up appointment in: 3 months with Dr. Eden Emms

## 2013-02-09 ENCOUNTER — Telehealth: Payer: Self-pay | Admitting: *Deleted

## 2013-02-09 DIAGNOSIS — I10 Essential (primary) hypertension: Secondary | ICD-10-CM

## 2013-02-09 NOTE — Telephone Encounter (Signed)
pt notified about lab results and to stop HCTZ; bmet 11/10 inthe [address] office. Pt vebalized underastanding to Plan of Care

## 2013-02-12 ENCOUNTER — Other Ambulatory Visit (INDEPENDENT_AMBULATORY_CARE_PROVIDER_SITE_OTHER): Payer: Medicare Other

## 2013-02-12 DIAGNOSIS — I10 Essential (primary) hypertension: Secondary | ICD-10-CM

## 2013-02-12 LAB — BASIC METABOLIC PANEL
BUN: 10 mg/dL (ref 6–23)
CO2: 27 mEq/L (ref 19–32)
Calcium: 8.9 mg/dL (ref 8.4–10.5)
Creatinine, Ser: 0.8 mg/dL (ref 0.4–1.2)
GFR: 93.42 mL/min (ref >60.00)
Glucose, Bld: 111 mg/dL — ABNORMAL HIGH (ref 70–99)
Sodium: 129 mEq/L — ABNORMAL LOW (ref 135–145)

## 2013-02-13 ENCOUNTER — Telehealth: Payer: Self-pay | Admitting: *Deleted

## 2013-02-13 DIAGNOSIS — I10 Essential (primary) hypertension: Secondary | ICD-10-CM

## 2013-02-13 NOTE — Telephone Encounter (Signed)
pt notified about lab results and will have repeat bmet 02/23/13.Marland KitchenMarland Kitchen

## 2013-02-23 ENCOUNTER — Telehealth: Payer: Self-pay | Admitting: *Deleted

## 2013-02-23 ENCOUNTER — Other Ambulatory Visit (INDEPENDENT_AMBULATORY_CARE_PROVIDER_SITE_OTHER): Payer: Medicare Other

## 2013-02-23 DIAGNOSIS — I10 Essential (primary) hypertension: Secondary | ICD-10-CM

## 2013-02-23 LAB — BASIC METABOLIC PANEL
Calcium: 9.3 mg/dL (ref 8.4–10.5)
Chloride: 100 mEq/L (ref 96–112)
Creatinine, Ser: 0.8 mg/dL (ref 0.4–1.2)

## 2013-02-23 NOTE — Telephone Encounter (Signed)
I have tried to reach pt x 2 about a form for Personal Care Sr[facility] referral. Per Bing Neighbors. PA states he did not order referral. Thinks maybe PCP did. I have not been able to reach pt to find out who put in referral.

## 2013-02-27 ENCOUNTER — Telehealth: Payer: Self-pay | Admitting: *Deleted

## 2013-02-27 NOTE — Telephone Encounter (Signed)
pt notified about lab results with verbal understanding  

## 2013-02-27 NOTE — Telephone Encounter (Signed)
reached pt today about personal care srvc's form. I asked her who put in the referral for this and she said she did herself. I stated referral has to come from a doc. office and Brandy Copeland. PA did not order referral. I explained has to come from PCP. Pt said ok. I then called [facility] and asked were pt's allowed to put in their own referral for srvc. Rep said no. I explained that pt had put in her own referral and has been advised form will be mailed back to her and she will need to take to her PCP, rep said PCP is the one who should be filling out the form and pt's are advised of this. I thank the [facility] rep for her help and verification of the process.

## 2013-03-22 ENCOUNTER — Other Ambulatory Visit (INDEPENDENT_AMBULATORY_CARE_PROVIDER_SITE_OTHER): Payer: Medicare Other

## 2013-03-22 DIAGNOSIS — E785 Hyperlipidemia, unspecified: Secondary | ICD-10-CM

## 2013-03-22 DIAGNOSIS — I2510 Atherosclerotic heart disease of native coronary artery without angina pectoris: Secondary | ICD-10-CM

## 2013-03-22 LAB — HEPATIC FUNCTION PANEL
ALT: 12 U/L (ref 0–35)
Alkaline Phosphatase: 68 U/L (ref 39–117)
Bilirubin, Direct: 0 mg/dL (ref 0.0–0.3)

## 2013-03-22 LAB — LIPID PANEL
Cholesterol: 158 mg/dL (ref 0–200)
Triglycerides: 47 mg/dL (ref 0.0–149.0)

## 2013-03-22 NOTE — Progress Notes (Signed)
Quick Note:  Patient notified of lab results and comments from [place], [place], - "lipids at goal, LFTs ok, continue with current treatment plan". Patient verbalized understanding and agreement with current treatment plan. Denies current questions or concerns. ______

## 2013-05-16 ENCOUNTER — Ambulatory Visit (INDEPENDENT_AMBULATORY_CARE_PROVIDER_SITE_OTHER): Payer: Medicare Other | Admitting: Cardiovascular Disease

## 2013-05-16 ENCOUNTER — Encounter: Payer: Self-pay | Admitting: Cardiovascular Disease

## 2013-05-16 VITALS — BP 158/74 | HR 90 | Wt 144.0 lb

## 2013-05-16 DIAGNOSIS — R011 Cardiac murmur, unspecified: Secondary | ICD-10-CM

## 2013-05-16 MED ORDER — NITROGLYCERIN 0.4 MG SL SUBL: 0.4000 mg | SUBLINGUAL_TABLET | SUBLINGUAL | Status: DC | PRN

## 2013-05-16 MED ORDER — CARVEDILOL 6.25 MG PO TABS: 6.2500 mg | ORAL_TABLET | Freq: Two times a day (BID) | ORAL | Status: DC

## 2013-05-16 NOTE — Patient Instructions (Addendum)
Your physician wants you to follow-up in:   [place] will receive a reminder letter in the mail two months in advance. If you don't receive a letter, please call our office to schedule the follow-up appointment. Your physician has recommended you make the following change in your medication:  STOP  AMLODIPINE  AND  START  CARVEDILOL  6.25 MG  1  TAB  TWICE DAILYNitroglycerin sublingual tablets What is this medicine? NITROGLYCERIN (nye troe GLI ser in) is a type of vasodilator. It relaxes blood vessels, increasing the blood and oxygen supply to your heart. This medicine is used to relieve chest pain caused by angina. It is also used to prevent chest pain before activities like climbing stairs, going outdoors in cold weather, or sexual activity. This medicine may be used for other purposes; ask your health care provider or pharmacist if you have questions. COMMON BRAND NAME(S): Nitroquick, Nitrostat, Nitrotab What should I tell my health care provider before I take this medicine? They need to know if you have any of these conditions: -anemia -head injury, recent stroke, or bleeding in the brain -liver disease -previous heart attack -an unusual or allergic reaction to nitroglycerin, other medicines, foods, dyes, or preservatives -pregnant or trying to get pregnant -breast-feeding How should I use this medicine? Take this medicine by mouth as needed. At the first sign of an angina attack (chest pain or tightness) place one tablet under your tongue. You can also take this medicine 5 to 10 minutes before an event likely to produce chest pain. Follow the directions on the prescription label. Let the tablet dissolve under the tongue. Do not swallow whole. Replace the dose if you accidentally swallow it. It will help if your mouth is not dry. Saliva around the tablet will help it to dissolve more quickly. Do not eat or drink, smoke or chew tobacco while a tablet is dissolving. If you are  not better within 5 minutes after taking ONE dose of nitroglycerin, call 9-1-1 immediately to seek emergency medical care. Do not take more than 3 nitroglycerin tablets over 15 minutes. If you take this medicine often to relieve symptoms of angina, your doctor or health care professional may provide you with different instructions to manage your symptoms. If symptoms do not go away after following these instructions, it is important to call 9-1-1 immediately. Do not take more than 3 nitroglycerin tablets over 15 minutes. Talk to your pediatrician regarding the use of this medicine in children. Special care may be needed. Overdosage: If you think you have taken too much of this medicine contact a poison control center or emergency room at once. NOTE: This medicine is only for you. Do not share this medicine with others. What if I miss a dose? This does not apply. This medicine is only used as needed. What may interact with this medicine? Do not take this medicine with any of the following medications: -certain migraine medicines like ergotamine and dihydroergotamine (DHE) -medicines used to treat erectile dysfunction like sildenafil, tadalafil, and vardenafil -riociguat This medicine may also interact with the following medications: -alteplase -aspirin -heparin -medicines for high blood pressure -medicines for mental depression -other medicines used to treat angina -phenothiazines like chlorpromazine, mesoridazine, prochlorperazine, thioridazine This list may not describe all possible interactions. Give your health care provider a list of all the medicines, herbs, non-prescription drugs, or dietary supplements you use. Also tell them if you smoke, drink alcohol, or use illegal drugs. Some items  may interact with your medicine. What should I watch for while using this medicine? Tell your doctor or health care professional if you feel your medicine is no longer working. Keep this medicine with  you at all times. Sit or lie down when you take your medicine to prevent falling if you feel dizzy or faint after using it. Try to remain calm. This will help you to feel better faster. If you feel dizzy, take several deep breaths and lie down with your feet propped up, or bend forward with your head resting between your knees. You may get drowsy or dizzy. Do not drive, use machinery, or do anything that needs mental alertness until you know how this drug affects you. Do not stand or sit up quickly, especially if you are an older patient. This reduces the risk of dizzy or fainting spells. Alcohol can make you more drowsy and dizzy. Avoid alcoholic drinks. Do not treat yourself for coughs, colds, or pain while you are taking this medicine without asking your doctor or health care professional for advice. Some ingredients may increase your blood pressure. What side effects may I notice from receiving this medicine? Side effects that you should report to your doctor or health care professional as soon as possible: -blurred vision -dry mouth -skin rash -sweating -the feeling of extreme pressure in the head -unusually weak or tired Side effects that usually do not require medical attention (report to your doctor or health care professional if they continue or are bothersome): -flushing of the face or neck -headache -irregular heartbeat, palpitations -nausea, vomiting This list may not describe all possible side effects. Call your doctor for medical advice about side effects. You may report side effects to FDA at 1-800-FDA-1088. Where should I keep my medicine? Keep out of the reach of children. Store at room temperature between 20 and 25 degrees C (68 and 77 degrees F). Store in [employer]. Protect from light and moisture. Keep tightly closed. Throw away any unused medicine after the expiration date. NOTE: This sheet is a summary. It may not cover all possible information. If you have  questions about this medicine, talk to your doctor, pharmacist, or health care provider.  2014, Elsevier/Gold Standard. (2013-01-11 10:27:26)

## 2013-05-16 NOTE — Progress Notes (Signed)
Patient ID: Brandy Copeland, female   DOB: 1945/12/13, 68 y.o.   MRN: [id number] 68 yo f/u for CAD  Cath done end of 9/14  Rx with plavix and imdur  Limited by severe COPD   Indication: SEMI duirng COPD exacerbation  Procedural Details: The right wrist was prepped, draped, and anesthetized with 1% lidocaine. Using the modified Seldinger technique, a 5 French sheath was introduced into the right radial artery. 3 mg of verapamil was administered through the sheath, weight-based unfractionated heparin was administered intravenously. Standard Judkins catheters were used for selective coronary angiography and left ventriculography. Catheter exchanges were performed over an exchange length guidewire. There were no immediate procedural complications. A TR band was used for radial hemostasis at the completion of the procedure. The patient was transferred to the post catheterization recovery area for further monitoring.  Procedural Findings:  Hemodynamics:  AO 154 80  LV 152 15 mmHg  Coronary angiography:  Coronary dominance: right  Left mainstem: Calcified 30% mid  Left anterior descending (LAD): 40% proximal 60% mid and 70% distal  IM: Large vessel With bifurcation 70% tortuous disease in smaller superior branch  Left circumflex (LCx): 40% ostial 70% mid vessel disease at take off of two small OM brances 30% distal disease  Right coronary artery (RCA): 60% proximal, 60-70% mid 50% distal  PDA: 40% ostial  Left ventriculography: Left ventricular systolic function is normal, LVEF is estimated at 55-65%, there is no significant mitral regurgitation  Impression: Will ask Dr Martinique to review. Mid RCA is tightest approachable lesion. The mid circumflex and superior branch of IM would be  Difficult to stent. May be that medical Rx is best to start since she has no chest pain and is severely limited by smoking and COPD   No chest pain Needs nitro Counseled on smoking cessation  No wheezing    ROS: Denies  fever, malais, weight loss, blurry vision, decreased visual acuity, cough, sputum, SOB, hemoptysis, pleuritic pain, palpitaitons, heartburn, abdominal pain, melena, lower extremity edema, claudication, or rash.  All other systems reviewed and negative  General: Affect appropriate Chronically ill black female  HEENT: normal Neck supple with no adenopathy JVP normal no bruits no thyromegaly Lungs clear with no wheezing and good diaphragmatic motion Heart:  S1/S2 SEM murmur, no rub, gallop or click PMI normal Abdomen: benighn, BS positve, no tenderness, no AAA no bruit.  No HSM or HJR Distal pulses intact with no bruits No edema Neuro non-focal Skin warm and dry No muscular weakness   Current Outpatient Prescriptions  Medication Sig Dispense Refill  . albuterol (PROVENTIL) (5 MG/ML) 0.5% nebulizer solution Take 0.5 mLs (2.5 mg total) by nebulization every 4 (four) hours as needed for wheezing or shortness of breath.  20 mL  12  . amLODipine (NORVASC) 10 MG tablet Take 1 tablet (10 mg total) by mouth daily.  30 tablet  11  . aspirin EC 81 MG tablet Take 1 tablet (81 mg total) by mouth daily.  30 tablet  11  . clopidogrel (PLAVIX) 75 MG tablet Take 1 tablet (75 mg total) by mouth daily with breakfast.  30 tablet  11  . isosorbide mononitrate (IMDUR) 15 mg TB24 24 hr tablet Take 0.5 tablets (15 mg total) by mouth daily.  30 tablet  30  . lisinopril (PRINIVIL,ZESTRIL) 10 MG tablet Take 1 tablet (10 mg total) by mouth daily.  30 tablet  11  . lovastatin (MEVACOR) 20 MG tablet Take 1 tablet (20 mg total) by mouth  at bedtime.  30 tablet  11  . oxybutynin (DITROPAN-XL) 5 MG 24 hr tablet Take 5 mg by mouth at bedtime.      . traMADol (ULTRAM) 50 MG tablet Take 50 mg by mouth every 6 (six) hours as needed.       No current facility-administered medications for this visit.    Allergies  Hctz  Electrocardiogram:  SR rate 96  LAD otherwise normal   Assessment and Plan

## 2013-05-16 NOTE — Assessment & Plan Note (Signed)
No active wheezing now continue inhalers  Think its ok to start beta blocker for CAD at this time  Discussed smoking cessation strategies and counseled for less than 10 minutes f/u primary

## 2013-05-16 NOTE — Assessment & Plan Note (Signed)
Loud but echo 9/14 with AV sclerosis not stenosis Reviewed

## 2013-05-16 NOTE — Assessment & Plan Note (Signed)
Stable with no angina and good activity level.  Continue medical Rx Nitro called in Stop amlodipine and start beta blocker

## 2013-05-16 NOTE — Assessment & Plan Note (Signed)
Well controlled.  Continue current medications and low sodium Dash type diet.    

## 2013-09-27 ENCOUNTER — Telehealth: Payer: Self-pay | Admitting: *Deleted

## 2013-09-27 NOTE — Telephone Encounter (Signed)
She has COPD I don't think the lisinopril is new /fu primary

## 2013-09-27 NOTE — Telephone Encounter (Signed)
PT  CALLED COMPLAINING WITH  DRY COUGH FOR APPROX   WEEK NO OTHER  SYMPTOMS   THINKS  MAY  BE  COMING  FROM  LISINOPRIL  ENCOURAGED  TO   MAYBE F/U WITH   PMD   MAY NEED  CXR  ALSO  WILL  FORWARD TO  DR Thaxton .Adonis Housekeeper

## 2013-09-27 NOTE — Telephone Encounter (Signed)
PT  NOTIFIED ./CY 

## 2013-11-05 ENCOUNTER — Other Ambulatory Visit: Payer: Self-pay | Admitting: Cardiovascular Disease

## 2013-11-15 ENCOUNTER — Ambulatory Visit (INDEPENDENT_AMBULATORY_CARE_PROVIDER_SITE_OTHER): Payer: Medicare Other | Admitting: Cardiovascular Disease

## 2013-11-15 ENCOUNTER — Encounter: Payer: Self-pay | Admitting: Cardiovascular Disease

## 2013-11-15 VITALS — BP 140/68 | HR 80 | Ht 63.0 in | Wt 130.0 lb

## 2013-11-15 DIAGNOSIS — J449 Chronic obstructive pulmonary disease, unspecified: Secondary | ICD-10-CM

## 2013-11-15 DIAGNOSIS — E785 Hyperlipidemia, unspecified: Secondary | ICD-10-CM

## 2013-11-15 DIAGNOSIS — R011 Cardiac murmur, unspecified: Secondary | ICD-10-CM

## 2013-11-15 NOTE — Assessment & Plan Note (Signed)
Well controlled.  Continue current medications and low sodium Dash type diet.    

## 2013-11-15 NOTE — Patient Instructions (Signed)
**Note De-Identified [name] Obfuscation** Your physician recommends that you continue on your current medications as directed. Please refer to the Current Medication list given to you today.  Your physician wants you to follow-up in: 6 month. You will receive a reminder letter in the mail two months in advance. If you don't receive a letter, please call our office to schedule the follow-up appointment.  Dr Johnsie Cancel is referring you to a pulmonologist for COPD

## 2013-11-15 NOTE — Assessment & Plan Note (Signed)
Cholesterol is at goal.  Continue current dose of statin and diet Rx.  No myalgias or side effects.  F/U  LFT's in 6 months. Lab Results  Component Value Date   LDLCALC 84 03/22/2013             

## 2013-11-15 NOTE — Assessment & Plan Note (Signed)
Stable with no angina and good activity level.  Continue medical Rx  

## 2013-11-15 NOTE — Assessment & Plan Note (Signed)
AV slcerosis f/u echo in a year or sooner if symptoms

## 2013-11-15 NOTE — Progress Notes (Signed)
Patient ID: Brandy Copeland, female   DOB: 02-Mar-1946, 68 y.o.   MRN: [id number] 68 yo f/u for CAD Cath done end of 9/14 Rx with plavix and imdur Limited by severe COPD  Indication: SEMI duirng COPD exacerbation  Procedural Details: The right wrist was prepped, draped, and anesthetized with 1% lidocaine. Using the modified Seldinger technique, a 5 French sheath was introduced into the right radial artery. 3 mg of verapamil was administered through the sheath, weight-based unfractionated heparin was administered intravenously. Standard Judkins catheters were used for selective coronary angiography and left ventriculography. Catheter exchanges were performed over an exchange length guidewire. There were no immediate procedural complications. A TR band was used for radial hemostasis at the completion of the procedure. The patient was transferred to the post catheterization recovery area for further monitoring.  Procedural Findings:  Hemodynamics:  AO 154 80  LV 152 15 mmHg  Coronary angiography:  Coronary dominance: right  Left mainstem: Calcified 30% mid  Left anterior descending (LAD): 40% proximal 60% mid and 70% distal  IM: Large vessel With bifurcation 70% tortuous disease in smaller superior branch  Left circumflex (LCx): 40% ostial 70% mid vessel disease at take off of two small OM brances 30% distal disease  Right coronary artery (RCA): 60% proximal, 60-70% mid 50% distal  PDA: 40% ostial  Left ventriculography: Left ventricular systolic function is normal, LVEF is estimated at 55-65%, there is no significant mitral regurgitation  Impression: Will ask Dr Martinique to review. Mid RCA is tightest approachable lesion. The mid circumflex and superior branch of IM would be  Difficult to stent. May be that medical Rx is best to start since she has no chest pain and is severely limited by smoking and COPD   No chest pain Needs nitro Counseled on smoking cessation No wheezing  Needs to f/u with our lung  doctors     ROS: Denies fever, malais, weight loss, blurry vision, decreased visual acuity, cough, sputum, SOB, hemoptysis, pleuritic pain, palpitaitons, heartburn, abdominal pain, melena, lower extremity edema, claudication, or rash.  All other systems reviewed and negative  General: Affect appropriate Chronically ill black female with walker  HEENT: normal Neck supple with no adenopathy JVP normal no bruits no thyromegaly Lungs poor air movement  no wheezing and good diaphragmatic motion Heart:  S1/S2 3/6 SEM  murmur, no rub, gallop or click PMI normal Abdomen: benighn, BS positve, no tenderness, no AAA no bruit.  No HSM or HJR Distal pulses intact with no bruits No edema Neuro non-focal Skin warm and dry No muscular weakness   Current Outpatient Prescriptions  Medication Sig Dispense Refill  . albuterol (PROVENTIL) (5 MG/ML) 0.5% nebulizer solution Take 0.5 mLs (2.5 mg total) by nebulization every 4 (four) hours as needed for wheezing or shortness of breath.  20 mL  12  . aspirin EC 81 MG tablet Take 1 tablet (81 mg total) by mouth daily.  30 tablet  11  . carvedilol (COREG) 6.25 MG tablet Take 1 tablet (6.25 mg total) by mouth 2 (two) times daily.  180 tablet  3  . clopidogrel (PLAVIX) 75 MG tablet Take 1 tablet (75 mg total) by mouth daily with breakfast.  30 tablet  11  . isosorbide mononitrate (IMDUR) 15 mg TB24 24 hr tablet Take 0.5 tablets (15 mg total) by mouth daily.  30 tablet  30  . lisinopril (PRINIVIL,ZESTRIL) 10 MG tablet Take 1 tablet (10 mg total) by mouth daily.  30 tablet  11  .  lovastatin (MEVACOR) 20 MG tablet Take 1 tablet (20 mg total) by mouth at bedtime.  30 tablet  11  . NITROSTAT 0.4 MG SL tablet 1 TABLET UNDER TONGUE IF NEEDED EVERY 5 MINUTES FOR CHEST FOR PAIN FOR 3 DOSES IT NO RELIEF AFTER 3RD DOSE CALL 911.  25 tablet  0  . oxybutynin (DITROPAN-XL) 5 MG 24 hr tablet Take 5 mg by mouth at bedtime.      . traMADol (ULTRAM) 50 MG tablet Take 50 mg by  mouth every 6 (six) hours as needed.       No current facility-administered medications for this visit.    Allergies  Hctz  Electrocardiogram:  11/14  SR rate 90 LAD   Assessment and Plan

## 2013-11-15 NOTE — Assessment & Plan Note (Signed)
Will refer to our pulmonary doctors  Flu shot then and counseled on smoking cessation

## 2013-11-19 ENCOUNTER — Encounter: Payer: Self-pay | Admitting: Internal Medicine

## 2013-11-19 ENCOUNTER — Ambulatory Visit (INDEPENDENT_AMBULATORY_CARE_PROVIDER_SITE_OTHER): Payer: Medicare Other | Admitting: Internal Medicine

## 2013-11-19 VITALS — BP 140/66 | HR 81 | Temp 97.6°F | Ht 63.5 in | Wt 139.4 lb

## 2013-11-19 DIAGNOSIS — F17200 Nicotine dependence, unspecified, uncomplicated: Secondary | ICD-10-CM

## 2013-11-19 DIAGNOSIS — I10 Essential (primary) hypertension: Secondary | ICD-10-CM

## 2013-11-19 DIAGNOSIS — J449 Chronic obstructive pulmonary disease, unspecified: Secondary | ICD-10-CM

## 2013-11-19 MED ORDER — VALSARTAN 80 MG PO TABS: 80.0000 mg | ORAL_TABLET | Freq: Every day | ORAL | Status: DC

## 2013-11-19 MED ORDER — PREDNISONE 10 MG PO TABS: ORAL_TABLET | ORAL | Status: DC

## 2013-11-19 MED ORDER — NEBIVOLOL HCL 10 MG PO TABS: 10.0000 mg | ORAL_TABLET | Freq: Every day | ORAL | Status: DC

## 2013-11-19 NOTE — Assessment & Plan Note (Signed)
D/c acei 11/19/2013 due to cough/ ? pseudowheeze > diovan 80 mg daily x 4 week trial D/c coreg 11/19/2013 due to bronchospasm on exam > bystolic 10 mg x 4 week trial    Comment: there is no way to sort out her various problems in terms of cause and efffect s trial off present meds, then consider add back at some point the coreg but as long as she continues to have a dry cough would avoid acei.

## 2013-11-19 NOTE — Progress Notes (Signed)
Subjective:    Patient ID: Brandy Copeland, female    DOB: [birth year],  MRN: [id number]  HPI   43 yobf smoker referred by Dr Johnsie Cancel for copd evalation/ no pfts on file   11/19/2013 1st [facility] office visit/ Brandy Copeland / on ACEi / still smoking  Chief Complaint  Patient presents with  . Pulmonary Consult    referred by Dr. Johnsie Cancel.  SOB and dry cough.     Constant sensation of throat drainage/ choking 24/7 with variably productive cough x one year  and doe x maybe a hundred feet some better p neb avg 3 x daily.  Coughs to point of vomiting each am / otherwise no sputum production, even in am.  No obvious other patterns in day to day or daytime variabilty or assoc  cp or chest tightness, subjective wheeze overt sinus or hb symptoms. No unusual exp hx or h/o childhood pna/ asthma or knowledge of premature birth.    Also denies any obvious fluctuation of symptoms with weather or environmental changes or other aggravating or alleviating factors except as outlined above   Current Medications, Allergies, Complete Past Medical History, Past Surgical History, Family History, and Social History were reviewed in [employer] record.            Review of Systems  Constitutional: Positive for unexpected weight change. Negative for fever.  HENT: Positive for rhinorrhea. Negative for congestion, dental problem, ear pain, nosebleeds, postnasal drip, sinus pressure, sneezing, sore throat and trouble swallowing.   Eyes: Negative for redness and itching.  Respiratory: Positive for cough, shortness of breath and wheezing. Negative for chest tightness.   Cardiovascular: Negative for palpitations and leg swelling.  Gastrointestinal: Negative for nausea and vomiting.  Genitourinary: Negative for dysuria.  Musculoskeletal: Negative for joint swelling.  Skin: Negative for rash.  Neurological: Negative for headaches.  Hematological: Does not bruise/bleed easily.    Psychiatric/Behavioral: Negative for dysphoric mood. The patient is not nervous/anxious.        Objective:   Physical Exam  Wt Readings from Last 3 Encounters:  11/19/13 139 lb 6.4 oz (63.231 kg)  11/15/13 130 lb (58.968 kg)  05/16/13 144 lb (65.318 kg)    HEENT mild turbinate edema.  Oropharynx no thrush or excess pnd or cobblestoning.  No JVD or cervical adenopathy. Mild accessory muscle hypertrophy. Trachea midline, nl thryroid. Chest was hyperinflated by percussion with diminished breath sounds and moderate increased exp time without wheeze. Hoover sign positive at mid inspiration. Regular rate and rhythm without murmur gallop or rub or increase P2 or edema.  Abd: no hsm, nl excursion. Ext warm without cyanosis or clubbing.       cxr by primary doctor not available "copd" ? Date  Mild hyperinflation present on cxr cxr 03/08/11    Lab Results  Component Value Date   PROBNP 441.9* 12/29/2012     Lab Results  Component Value Date   WBC 11.5* 01/04/2013   HGB 14.7 01/04/2013   HCT 42.5 01/04/2013   MCV 94.7 01/04/2013   PLT 321 01/04/2013       Chemistry      Component Value Date/Time   NA 134* 02/23/2013 1154   K 3.8 02/23/2013 1154   CL 100 02/23/2013 1154   CO2 23 02/23/2013 1154   BUN 15 02/23/2013 1154   CREATININE 0.8 02/23/2013 1154      Component Value Date/Time   CALCIUM 9.3 02/23/2013 1154   ALKPHOS 68 03/22/2013 0921  AST 15 03/22/2013 0921   ALT 12 03/22/2013 0921   BILITOT 0.1* 03/22/2013 0921          Assessment & Plan:

## 2013-11-19 NOTE — Assessment & Plan Note (Signed)

## 2013-11-19 NOTE — Patient Instructions (Addendum)
Stop corevidol and start bystolic 10 mg one daily in its place Stop lisinopril and take valsartan 80 mg one daily in its place Prednisone 10 mg take  4 each am x 2 days,   2 each am x 2 days,  1 each am x 2 days and stop Ok to use your nebulizer up to [employer] 4 hours as needed for breathing   The key is to stop smoking completely before smoking completely stops you!  Please schedule a follow up office visit in 4 weeks, sooner if needed with pfts

## 2013-11-19 NOTE — Assessment & Plan Note (Signed)
DDX of  difficult airways management all start with A and  include Adherence, Ace Inhibitors, Acid Reflux, Active Sinus Disease, Alpha 1 Antitripsin deficiency, Anxiety masquerading as Airways dz,  ABPA,  allergy(esp in young), Aspiration (esp in elderly), Adverse effects of DPI,  Active smokers, plus two Bs  = Bronchiectasis and Beta blocker use..and one C= CHF  Adherence is always the initial "prime suspect" and is a multilayered concern that requires a "trust but verify" approach in every patient - starting with knowing how to use medications, especially inhalers, correctly, keeping up with refills and understanding the fundamental difference between maintenance and prns vs those medications only taken for a very short course and then stopped and not refilled.   ACEi high on usual list of suspects due to gag/vomit/tickle > needs trial off to prove it one way or the other  ?BB effect > active wheezing on exam p saba today >  Strongly prefer in this setting: Bystolic, the most beta -1  selective Beta blocker available in sample form, with bisoprolol the most selective generic choice  on the market.   Active smoking > discussed separately   See instructions for specific recommendations which were reviewed directly with the patient who was given a copy with highlighter outlining the key components.

## 2013-12-05 ENCOUNTER — Other Ambulatory Visit: Payer: Self-pay | Admitting: Family Medicine

## 2013-12-17 ENCOUNTER — Telehealth: Payer: Self-pay | Admitting: Internal Medicine

## 2013-12-17 ENCOUNTER — Other Ambulatory Visit: Payer: Self-pay | Admitting: Family Medicine

## 2013-12-17 MED ORDER — NEBIVOLOL HCL 10 MG PO TABS: 10.0000 mg | ORAL_TABLET | Freq: Every day | ORAL | Status: DC

## 2013-12-17 NOTE — Telephone Encounter (Signed)
Called pt. Confirmed RX needed. Nothing further needed

## 2013-12-17 NOTE — Telephone Encounter (Signed)
Per 11/19/13 ov: Patient Instructions      Stop corevidol and start bystolic 10 mg one daily in its place Stop lisinopril and take valsartan 80 mg one daily in its place Prednisone 10 mg take  4 each am x 2 days,   2 each am x 2 days,  1 each am x 2 days and stop Ok to use your nebulizer up to [employer] 4 hours as needed for breathing  The key is to stop smoking completely before smoking completely stops you! Please schedule a follow up office visit in 4 weeks, sooner if needed with pfts      ATC PT LMTCB x1

## 2013-12-17 NOTE — Telephone Encounter (Signed)
[phone] returning call

## 2013-12-20 ENCOUNTER — Telehealth: Payer: Self-pay | Admitting: Internal Medicine

## 2013-12-20 NOTE — Telephone Encounter (Signed)
Received form for PA from cover my meds  for the bystolic 10 mg daily.  This is not covered by her insurance company.  MW do you want to change this medication or proceed with the PA.  Please advise. Thanks

## 2013-12-21 MED ORDER — BISOPROLOL FUMARATE 5 MG PO TABS: 5.0000 mg | ORAL_TABLET | Freq: Every day | ORAL | Status: DC

## 2013-12-21 NOTE — Telephone Encounter (Signed)
Try bisoprolol 5 mg daily

## 2013-12-21 NOTE — Telephone Encounter (Signed)
Called spoke with patient and informed her of the medication change as dictated by MW.  Pt okay with this changed and voiced her understanding.  She will finish her Bystolic samples and then transition to the Bisoprolol 5mg  daily.  [place] and spoke with pharmacist Jenny Reichmann.  Gave verbal order for the change in medications from Bystolic 10mg  to Bisoprolol 5mg .  John voiced his understanding.  Med list updated.  Nothing further needed at this time; will sign off.

## 2013-12-25 ENCOUNTER — Ambulatory Visit: Payer: Medicare Other | Admitting: Internal Medicine

## 2014-01-01 ENCOUNTER — Other Ambulatory Visit: Payer: Self-pay | Admitting: Physician Assistant

## 2014-01-01 ENCOUNTER — Other Ambulatory Visit: Payer: Self-pay | Admitting: Family Medicine

## 2014-01-14 ENCOUNTER — Ambulatory Visit: Payer: Medicare Other | Admitting: Internal Medicine

## 2014-01-15 ENCOUNTER — Other Ambulatory Visit: Payer: Self-pay | Admitting: Internal Medicine

## 2014-01-15 MED ORDER — BISOPROLOL FUMARATE 5 MG PO TABS: 5.0000 mg | ORAL_TABLET | Freq: Every day | ORAL | Status: DC

## 2014-01-30 ENCOUNTER — Ambulatory Visit (INDEPENDENT_AMBULATORY_CARE_PROVIDER_SITE_OTHER): Payer: Medicare Other | Admitting: Internal Medicine

## 2014-01-30 ENCOUNTER — Encounter: Payer: Self-pay | Admitting: Internal Medicine

## 2014-01-30 VITALS — BP 158/90 | HR 72 | Ht 63.5 in | Wt 139.0 lb

## 2014-01-30 DIAGNOSIS — F17210 Nicotine dependence, cigarettes, uncomplicated: Secondary | ICD-10-CM

## 2014-01-30 DIAGNOSIS — I10 Essential (primary) hypertension: Secondary | ICD-10-CM | POA: Diagnosis not present

## 2014-01-30 DIAGNOSIS — J449 Chronic obstructive pulmonary disease, unspecified: Secondary | ICD-10-CM

## 2014-01-30 DIAGNOSIS — F17200 Nicotine dependence, unspecified, uncomplicated: Secondary | ICD-10-CM

## 2014-01-30 DIAGNOSIS — Z720 Tobacco use: Secondary | ICD-10-CM | POA: Diagnosis not present

## 2014-01-30 LAB — PULMONARY FUNCTION TEST
DL/VA % PRED: 82 %
DL/VA: 3.89 ml/min/mmHg/L
DLCO UNC % PRED: 55 %
DLCO unc: 13.04 ml/min/mmHg
FEF 25-75 PRE: 0.54 L/s
FEF 25-75 Post: 0.51 L/sec
FEF2575-%Change-Post: -5 %
FEF2575-%Pred-Post: 29 %
FEF2575-%Pred-Pre: 31 %
FEV1-%Change-Post: 1 %
FEV1-%PRED-PRE: 54 %
FEV1-%Pred-Post: 55 %
FEV1-PRE: 1.01 L
FEV1-Post: 1.03 L
FEV1FVC-%Change-Post: -2 %
FEV1FVC-%Pred-Pre: 77 %
FEV6-%Change-Post: 3 %
FEV6-%PRED-POST: 75 %
FEV6-%Pred-Pre: 72 %
FEV6-POST: 1.73 L
FEV6-Pre: 1.67 L
FEV6FVC-%CHANGE-POST: -1 %
FEV6FVC-%Pred-Post: 102 %
FEV6FVC-%Pred-Pre: 103 %
FVC-%Change-Post: 4 %
FVC-%Pred-Post: 73 %
FVC-%Pred-Pre: 70 %
FVC-Post: 1.76 L
FVC-Pre: 1.68 L
POST FEV1/FVC RATIO: 59 %
POST FEV6/FVC RATIO: 98 %
Pre FEV1/FVC ratio: 60 %
Pre FEV6/FVC Ratio: 100 %
RV % pred: 135 %
RV: 2.88 L
TLC % PRED: 91 %
TLC: 4.56 L

## 2014-01-30 MED ORDER — VALSARTAN 80 MG PO TABS: ORAL_TABLET | ORAL | Status: DC

## 2014-01-30 NOTE — Progress Notes (Signed)
Subjective:    Patient ID: Brandy Copeland, female    DOB: [birth year],  MRN: [id number]    Brief patient profile:  56 yobf smoker referred by Dr Johnsie Cancel for copd evalation/ no pfts on file    History of Present Illness  11/19/2013 1st [facility] office visit/ Brandy Copeland / on ACEi / still smoking  Chief Complaint  Patient presents with  . Pulmonary Consult    referred by Dr. Johnsie Cancel.  SOB and dry cough.     Constant sensation of throat drainage/ choking 24/7 with variably productive cough x one year  and doe x maybe a hundred feet some better p neb avg 3 x daily.  Coughs to point of vomiting each am / otherwise no sputum production, even in am. rec Stop corevidol and start bystolic 10 mg one daily in its place Stop lisinopril and take valsartan 80 mg one daily in its place Prednisone 10 mg take  4 each am x 2 days,   2 each am x 2 days,  1 each am x 2 days and stop Ok to use your nebulizer up to [employer] 4 hours as needed for breathing  The key is to stop smoking completely before smoking completely stops you!   01/30/2014 f/u ov/Brandy Copeland re:  GOLD II copd / still smoking  Chief Complaint  Patient presents with  . Follow-up    patient had pft today: Patient has been having sob, wheezing and chest tightness.    No saba in several weeks/ no cough, not really limited by doe at this point  - has neb not using   No obvious day to day or daytime variabilty or assoc chronic cough or cp or chest tightness, subjective wheeze overt sinus or hb symptoms. No unusual exp hx or h/o childhood pna/ asthma or knowledge of premature birth.  Sleeping ok without nocturnal  or early am exacerbation  of respiratory  c/o's or need for noct saba. Also denies any obvious fluctuation of symptoms with weather or environmental changes or other aggravating or alleviating factors except as outlined above   Current Medications, Allergies, Complete Past Medical History, Past Surgical History, Family History, and Social  History were reviewed in [employer] record.  ROS  The following are not active complaints unless bolded sore throat, dysphagia, dental problems, itching, sneezing,  nasal congestion or excess/ purulent secretions, ear ache,   fever, chills, sweats, unintended wt loss, pleuritic or exertional cp, hemoptysis,  orthopnea pnd or leg swelling, presyncope, palpitations, heartburn, abdominal pain, anorexia, nausea, vomiting, diarrhea  or change in bowel or urinary habits, change in stools or urine, dysuria,hematuria,  rash, arthralgias, visual complaints, headache, numbness weakness or ataxia or problems with walking or coordination,  change in mood/affect or memory.                           Objective:   Physical Exam  amb bf nad with rolling walker  01/30/2014    138  Wt Readings from Last 3 Encounters:  11/19/13 139 lb 6.4 oz (63.231 kg)  11/15/13 130 lb (58.968 kg)  05/16/13 144 lb (65.318 kg)    HEENT mild turbinate edema.  Oropharynx no thrush or excess pnd or cobblestoning.  No JVD or cervical adenopathy. Mild accessory muscle hypertrophy. Trachea midline, nl thryroid. Chest was hyperinflated by percussion with diminished breath sounds and moderate increased exp time without wheeze. Hoover sign positive at mid inspiration. Regular rate  and rhythm without murmur gallop or rub or increase P2 or edema.  Abd: no hsm, nl excursion. Ext warm without cyanosis or clubbing.       cxr by primary doctor not available "copd" ? Date  pcxr 12/29/12 mild hyperinflation    Lab Results  Component Value Date   PROBNP 441.9* 12/29/2012     Lab Results  Component Value Date   WBC 11.5* 01/04/2013   HGB 14.7 01/04/2013   HCT 42.5 01/04/2013   MCV 94.7 01/04/2013   PLT 321 01/04/2013       Chemistry      Component Value Date/Time   NA 134* 02/23/2013 1154   K 3.8 02/23/2013 1154   CL 100 02/23/2013 1154   CO2 23 02/23/2013 1154   BUN 15 02/23/2013 1154    CREATININE 0.8 02/23/2013 1154      Component Value Date/Time   CALCIUM 9.3 02/23/2013 1154   ALKPHOS 68 03/22/2013 0921   AST 15 03/22/2013 0921   ALT 12 03/22/2013 0921   BILITOT 0.1* 03/22/2013 0921          Assessment & Plan:

## 2014-01-30 NOTE — Progress Notes (Signed)
PFT done today. 

## 2014-01-30 NOTE — Patient Instructions (Addendum)
Stop lisinopril  Take valsartan 2 daily until you return   See Tammy NP w/in 1 weeks with all your medications, even over the counter meds, separated in two separate bags, the ones you take no matter what vs the ones you stop once you feel better and take only as needed when you feel you need them.   Tammy  will generate for you a new user friendly medication calendar that will put [place] all on the same page re: your medication use.     Without this process, it simply isn't possible to assure that we are providing  your outpatient care  with  the attention to detail we feel you deserve.   If we cannot assure that you're getting that kind of care,  then we cannot manage your problem effectively from this clinic.  Once you have seen Tammy and we are sure that we're all on the same page with your medication use she will arrange follow up with me.

## 2014-01-31 NOTE — Assessment & Plan Note (Signed)
D/c coreg 11/19/2013 due to bronchospasm on exam > bystolic 10 mg x 4 week trial  D/c acei 01/30/2014 due to psuedowheeze>diovan 80 mg daily x 4 week trial  Ideally should be on arb and bistolic to avoid confusion with interpetation of symptoms > recheck bp in one week

## 2014-01-31 NOTE — Assessment & Plan Note (Addendum)
-   trial off  coreg 11/19/2013 >   - PFT's 01/30/14 FEV1  1.01 (54%) ratio 60 and no change p saba/ DLCO 55 corrects to 82%  - ACEi stopped 01/31/2014   DDX of  difficult airways management all start with A and  include Adherence, Ace Inhibitors, Acid Reflux, Active Sinus Disease, Alpha 1 Antitripsin deficiency, Anxiety masquerading as Airways dz,  ABPA,  allergy(esp in young), Aspiration (esp in elderly), Adverse effects of DPI,  Active smokers, plus two Bs  = Bronchiectasis and Beta blocker use..and one C= CHF  Active smoking addressed separately  ACEi need to be stopped permanently (see hbp)  Struggling with concept of med reconciliation.  To keep things simple, I have asked the patient to first separate medicines that are perceived as maintenance, that is to be taken daily "no matter what", from those medicines that are taken on only on an as-needed basis and I have given the patient examples of both, and then return to see our NP to generate a  detailed  medication calendar which should be followed until the next physician sees the patient and updates it.

## 2014-01-31 NOTE — Assessment & Plan Note (Signed)

## 2014-02-05 ENCOUNTER — Encounter: Payer: Self-pay | Admitting: Adult Health

## 2014-02-05 ENCOUNTER — Ambulatory Visit (INDEPENDENT_AMBULATORY_CARE_PROVIDER_SITE_OTHER): Payer: Medicare Other | Admitting: Adult Health

## 2014-02-05 VITALS — BP 136/76 | HR 72 | Temp 97.5°F | Ht 61.0 in | Wt 141.3 lb

## 2014-02-05 DIAGNOSIS — J449 Chronic obstructive pulmonary disease, unspecified: Secondary | ICD-10-CM

## 2014-02-05 NOTE — Patient Instructions (Addendum)
Bring CD of your most recent chest xray to next office visit.  Follow med calendar closely and bring to each visit.  Stop Coreg .  Work on not smoking  Increase Duoneb Three times daily .  Follow up Dr. Melvyn Novas  In 6 weeks and As needed

## 2014-02-07 NOTE — Progress Notes (Signed)
Subjective:    Patient ID: Brandy Copeland, female    DOB: [birth year],  MRN: [id number]    Brief patient profile:  40 yobf smoker referred by Dr Johnsie Cancel for copd evalation/ no pfts on file    History of Present Illness  11/19/2013 1st [facility] office visit/ Wert / on ACEi / still smoking  Chief Complaint  Patient presents with  . Pulmonary Consult    referred by Dr. Johnsie Cancel.  SOB and dry cough.     Constant sensation of throat drainage/ choking 24/7 with variably productive cough x one year  and doe x maybe a hundred feet some better p neb avg 3 x daily.  Coughs to point of vomiting each am / otherwise no sputum production, even in am. rec Stop corevidol and start bystolic 10 mg one daily in its place Stop lisinopril and take valsartan 80 mg one daily in its place Prednisone 10 mg take  4 each am x 2 days,   2 each am x 2 days,  1 each am x 2 days and stop Ok to use your nebulizer up to [employer] 4 hours as needed for breathing  The key is to stop smoking completely before smoking completely stops you!   01/30/2014 f/u ov/Wert re:  GOLD II copd / still smoking  Chief Complaint  Patient presents with  . Follow-up    patient had pft today: Patient has been having sob, wheezing and chest tightness.    No saba in several weeks/ no cough, not really limited by doe at this point  - has neb not using  >>d/c ACE   02/05/14 follow up /COPD [place] Rm II /smoker  Patient returns for one-week follow-up medication review. Reports that her breathing is doing okay. She denies any chest pain, shortness breath, orthopnea, PND, or leg swelling We reviewed all medications organize them into a medication count with patient education Brought both coreg and bisoprolol bottles ,was suppose to have stopped coreg. Unclear if taking both.   She is still smoking. We discussed smoking cessation. Last ov ACE inhibitor stopped . Does feel cough is better.      Current Medications, Allergies, Complete  Past Medical History, Past Surgical History, Family History, and Social History were reviewed in [employer] record.  ROS  The following are not active complaints unless bolded sore throat, dysphagia, dental problems, itching, sneezing,  nasal congestion or excess/ purulent secretions, ear ache,   fever, chills, sweats, unintended wt loss, pleuritic or exertional cp, hemoptysis,  orthopnea pnd or leg swelling, presyncope, palpitations, heartburn, abdominal pain, anorexia, nausea, vomiting, diarrhea  or change in bowel or urinary habits, change in stools or urine, dysuria,hematuria,  rash, arthralgias, visual complaints, headache, numbness weakness or ataxia or problems with walking or coordination,  change in mood/affect or memory.                           Objective:   Physical Exam  amb bf nad with rolling walker  01/30/2014    138 >141 02/07/2014   HEENT mild turbinate edema.  Oropharynx no thrush or excess pnd or cobblestoning.  No JVD or cervical adenopathy. Mild accessory muscle hypertrophy. Trachea midline, nl thryroid. Chest was hyperinflated by percussion with diminished breath sounds and moderate increased exp time without wheeze. Hoover sign positive at mid inspiration. Regular rate and rhythm without murmur gallop or rub or increase P2 or edema.  Abd: no hsm,  nl excursion. Ext warm without cyanosis or clubbing.       cxr by primary doctor not available "copd" ? Date  pcxr 12/29/12 mild hyperinflation    Lab Results  Component Value Date   PROBNP 441.9* 12/29/2012     Lab Results  Component Value Date   WBC 11.5* 01/04/2013   HGB 14.7 01/04/2013   HCT 42.5 01/04/2013   MCV 94.7 01/04/2013   PLT 321 01/04/2013       Chemistry      Component Value Date/Time   NA 134* 02/23/2013 1154   K 3.8 02/23/2013 1154   CL 100 02/23/2013 1154   CO2 23 02/23/2013 1154   BUN 15 02/23/2013 1154   CREATININE 0.8 02/23/2013 1154      Component Value  Date/Time   CALCIUM 9.3 02/23/2013 1154   ALKPHOS 68 03/22/2013 0921   AST 15 03/22/2013 0921   ALT 12 03/22/2013 0921   BILITOT 0.1* 03/22/2013 0921          Assessment & Plan:

## 2014-02-07 NOTE — Assessment & Plan Note (Addendum)
COPD -Moderate in smoker.  Smoking cessation discussed .  Need copy of recent CXR otherwise need CXR on return .  Patient's medications were reviewed today and patient education was given. Computerized medication calendar was adjusted/completed  Would avoid ace inhibitors and nonselective BB if possible .   Plan  Bring CD of your most recent chest xray to next office visit.  Follow med calendar closely and bring to each visit.  Stop Coreg .  Work on not smoking  Increase Duoneb Three times daily .  Follow up Dr. Melvyn Novas  In 6 weeks and As needed

## 2014-02-11 ENCOUNTER — Other Ambulatory Visit: Payer: Self-pay | Admitting: Internal Medicine

## 2014-02-22 NOTE — Addendum Note (Signed)
Addended by: Parke Poisson E on: 02/22/2014 12:48 PM   Modules accepted: Orders, Medications

## 2014-02-25 ENCOUNTER — Telehealth: Payer: Self-pay | Admitting: Internal Medicine

## 2014-02-25 MED ORDER — VALSARTAN 80 MG PO TABS: ORAL_TABLET | ORAL | Status: DC

## 2014-02-25 NOTE — Telephone Encounter (Signed)
Pt asks to be notified once this Rx has been sent & can be reached at [phone].  Pt states she is out of this med & needs it asap.  Brandy Copeland

## 2014-02-25 NOTE — Telephone Encounter (Signed)
I have sent this into the pharmacy. Pt is aware of this as well. Nothing further needed

## 2014-02-26 ENCOUNTER — Other Ambulatory Visit: Payer: Self-pay | Admitting: Cardiovascular Disease

## 2014-03-05 ENCOUNTER — Encounter: Payer: Self-pay | Admitting: Internal Medicine

## 2014-03-05 ENCOUNTER — Ambulatory Visit (INDEPENDENT_AMBULATORY_CARE_PROVIDER_SITE_OTHER): Payer: Medicare Other | Admitting: Internal Medicine

## 2014-03-05 VITALS — BP 132/80 | HR 72 | Ht 63.5 in | Wt 142.0 lb

## 2014-03-05 DIAGNOSIS — Z720 Tobacco use: Secondary | ICD-10-CM

## 2014-03-05 DIAGNOSIS — F17200 Nicotine dependence, unspecified, uncomplicated: Secondary | ICD-10-CM

## 2014-03-05 DIAGNOSIS — J449 Chronic obstructive pulmonary disease, unspecified: Secondary | ICD-10-CM

## 2014-03-05 DIAGNOSIS — I10 Essential (primary) hypertension: Secondary | ICD-10-CM

## 2014-03-05 DIAGNOSIS — F17210 Nicotine dependence, cigarettes, uncomplicated: Secondary | ICD-10-CM

## 2014-03-05 NOTE — Progress Notes (Signed)
Subjective:    Patient ID: Brandy Copeland, female    DOB: 08/06/1945,  MRN: [id number]    Brief patient profile:  30 yobf smoker referred by Dr Johnsie Cancel for copd evaluation and proved to have GOLD Copeland critiera 01/2014    Primary [place]    History of Present Illness  11/19/2013 1st [facility] office visit/ Brandy Copeland / on ACEi / still smoking  Chief Complaint  Patient presents with  . Pulmonary Consult    referred by Dr. Johnsie Cancel.  SOB and dry cough.     Constant sensation of throat drainage/ choking 24/7 with variably productive cough x one year  and doe x maybe a hundred feet some better p neb avg 3 x daily.  Coughs to point of vomiting each am / otherwise no sputum production, even in am. rec Stop corevidol and start bystolic 10 mg one daily in its place Stop lisinopril and take valsartan 80 mg one daily in its place Prednisone 10 mg take  4 each am x 2 days,   2 each am x 2 days,  1 each am x 2 days and stop Ok to use your nebulizer up to [employer] 4 hours as needed for breathing  The key is to stop smoking completely before smoking completely stops you!   01/30/2014 f/u ov/Brandy Copeland re:  GOLD Copeland copd / still smoking  Chief Complaint  Patient presents with  . Follow-up    patient had pft today: Patient has been having sob, wheezing and chest tightness.    No saba in several weeks/ no cough, not really limited by doe at this point  - has neb not using  >>d/c ACE     02/05/14 follow up /COPD Brandy Copeland /smoker  Patient returns for one-week follow-up medication review. Reports that her breathing is doing okay. She denies any chest pain, shortness breath, orthopnea, PND, or leg swelling We reviewed all medications organize them into a medication count with patient education Brought both coreg and bisoprolol bottles ,was suppose to have stopped coreg. Unclear if taking both.   She is still smoking. We discussed smoking cessation. Last ov ACE inhibitor stopped . Does feel cough is better.    rec Bring CD of your most recent chest xray to next office visit.  Follow med calendar closely and bring to each visit.  Stop Coreg .  Work on not smoking  Increase Duoneb Three times daily .    03/05/2014 f/u ov/Brandy Copeland re: copd GOLD Copeland still smoking / rx = duoneb only / using med calendar well  Chief Complaint  Patient presents with  . Follow-up    Pt states that her breathing has improved some since her last visit. She still has minimal wheezing. No new co's today.   walking with walker nad/ no 02 on duoneb tid no noct complaints  Very sedentary, can go sev hundred feet s stopping  No obvious day to day or daytime variabilty or assoc chronic cough or cp or chest tightness, subjective wheeze overt sinus or hb symptoms. No unusual exp hx or h/o childhood pna/ asthma or knowledge of premature birth.  Sleeping ok without nocturnal  or early am exacerbation  of respiratory  c/o's or need for noct saba. Also denies any obvious fluctuation of symptoms with weather or environmental changes or other aggravating or alleviating factors except as outlined above   Current Medications, Allergies, Complete Past Medical History, Past Surgical History, Family History, and Social History were reviewed in [facility] electronic medical record.  ROS  The following are not active complaints unless bolded sore throat, dysphagia, dental problems, itching, sneezing,  nasal congestion or excess/ purulent secretions, ear ache,   fever, chills, sweats, unintended wt loss, pleuritic or exertional cp, hemoptysis,  orthopnea pnd or leg swelling, presyncope, palpitations, heartburn, abdominal pain, anorexia, nausea, vomiting, diarrhea  or change in bowel or urinary habits, change in stools or urine, dysuria,hematuria,  rash, arthralgias, visual complaints, headache, numbness weakness or ataxia or problems with walking or coordination,  change in mood/affect or memory.                                Objective:   Physical Exam  amb bf nad with rolling walker  01/30/2014    138 >141 02/07/2014  > 03/05/2014  142  HEENT mild turbinate edema.  Oropharynx no thrush or excess pnd or cobblestoning.  No JVD or cervical adenopathy. Mild accessory muscle hypertrophy. Trachea midline, nl thryroid. Chest was hyperinflated by percussion with diminished breath sounds and moderate increased exp time without wheeze. Hoover sign positive at mid inspiration. Regular rate and rhythm without murmur gallop or rub or increase P2 or edema.  Abd: no hsm, nl excursion. Ext warm without cyanosis or clubbing.       cxr by primary doctor not available "copd" ? Date  pcxr 12/29/12 mild hyperinflation    Lab Results  Component Value Date   PROBNP 441.9* 12/29/2012     Lab Results  Component Value Date   WBC 11.5* 01/04/2013   HGB 14.7 01/04/2013   HCT 42.5 01/04/2013   MCV 94.7 01/04/2013   PLT 321 01/04/2013       Chemistry      Component Value Date/Time   NA 134* 02/23/2013 1154   K 3.8 02/23/2013 1154   CL 100 02/23/2013 1154   CO2 23 02/23/2013 1154   BUN 15 02/23/2013 1154   CREATININE 0.8 02/23/2013 1154      Component Value Date/Time   CALCIUM 9.3 02/23/2013 1154   ALKPHOS 68 03/22/2013 0921   AST 15 03/22/2013 0921   ALT 12 03/22/2013 0921   BILITOT 0.1* 03/22/2013 0921          Assessment & Plan:

## 2014-03-05 NOTE — Patient Instructions (Addendum)
Ok to just use the duoneb automatically in am and at bedtime but up to 4x daily if breathing not good as per your med calendar  See calendar for specific medication instructions and bring it back for each and every office visit for every healthcare provider you see.  Without it,  you may not receive the best quality medical care that we feel you deserve.  You will note that the calendar groups together  your maintenance  medications that are timed at particular times of the day.  Think of this as your checklist for what your doctor has instructed you to do until your next evaluation to see what benefit  there is  to staying on a consistent group of medications intended to keep you well.  The other group at the bottom is entirely up to you to use as you see fit  for specific symptoms that may arise between visits that require you to treat them on an as needed basis.  Think of this as your action plan or "what if" list.   Separating the top medications from the bottom group is fundamental to providing you adequate care going forward.    Late add :  F/u ov final visit with cxr due 6 weeks

## 2014-03-06 NOTE — Assessment & Plan Note (Addendum)
-   trial off  coreg 11/19/2013 >   - PFT's 01/30/14 FEV1  1.01 (54%) ratio 60 and no change p saba/ DLCO 55 corrects to 82%  - ACEi stopped 01/31/2014   Adequate control on present rx, reviewed > no change in rx needed  = duoneb up to qid and return here if losing ground with activity tolerance, which is quit a bit improved over baseline on coreg/acei/ and cough is gone despite still smoking (discussed separately )    Each maintenance medication was reviewed in detail including most importantly the difference between maintenance and as needed and under what circumstances the prns are to be used. This was done in the context of a medication calendar review which provided the patient with a user-friendly unambiguous mechanism for medication administration and reconciliation and provides an action plan for all active problems. It is critical that this be shown to every doctor  for modification during the office visit if necessary so the patient can use it as a working document.

## 2014-03-06 NOTE — Assessment & Plan Note (Signed)
>   3 min  I took an extended  opportunity with this patient to outline the consequences of continued cigarette use  in airway disorders based on all the data we have from the multiple national lung health studies (perfomed over decades at millions of dollars in cost)  indicating that smoking cessation, not choice of inhalers or physicians, is the most important aspect of care.    Informed her she could expect to cut down on her resp meds and f/u if stops smoking now  Needs final f/u in 6 weeks to be sure neb use not escalating and if so consider lama/ics neb  See instructions for specific recommendations which were reviewed directly with the patient who was given a copy with highlighter outlining the key components.

## 2014-03-11 ENCOUNTER — Other Ambulatory Visit: Payer: Self-pay | Admitting: Cardiovascular Disease

## 2014-03-14 ENCOUNTER — Encounter (HOSPITAL_COMMUNITY): Payer: Self-pay | Admitting: Cardiovascular Disease

## 2014-03-28 ENCOUNTER — Other Ambulatory Visit: Payer: Self-pay | Admitting: *Deleted

## 2014-03-28 MED ORDER — LOVASTATIN 20 MG PO TABS: 20.0000 mg | ORAL_TABLET | Freq: Every day | ORAL | Status: DC

## 2014-04-08 ENCOUNTER — Telehealth: Payer: Self-pay | Admitting: Internal Medicine

## 2014-04-08 MED ORDER — BISOPROLOL FUMARATE 5 MG PO TABS: 5.0000 mg | ORAL_TABLET | Freq: Every day | ORAL | Status: DC

## 2014-04-08 NOTE — Telephone Encounter (Signed)
Spoke with pt and advised that refill for Bisoprolol was sent to pharmacy.

## 2014-04-09 ENCOUNTER — Telehealth: Payer: Self-pay | Admitting: Internal Medicine

## 2014-04-09 ENCOUNTER — Other Ambulatory Visit: Payer: Self-pay | Admitting: Internal Medicine

## 2014-04-09 NOTE — Telephone Encounter (Signed)
Called and spoke with the pharmacy and they stated that they have an rx on file for the bisoprolol and they will get this filled for her.  i called and spoke with the pt and she is aware and nothing further is needed.

## 2014-04-11 ENCOUNTER — Telehealth: Payer: Self-pay | Admitting: Internal Medicine

## 2014-04-11 MED ORDER — VALSARTAN 80 MG PO TABS: ORAL_TABLET | ORAL | Status: DC

## 2014-04-11 NOTE — Telephone Encounter (Signed)
Spoke with pt and reviewed her medications.  Clarified with pt that her Bystolic was changed to Bisoprolol due to insurance coverage.  Clarified that pt should be on Valsartan and Bisoprolol and not Bystolic.  Pt verified understanding.  Refills for Valsartan sent to pharmacy.

## 2014-04-11 NOTE — Telephone Encounter (Signed)
Pt calling a/b refill for valsartan and bystoliz.Brandy Copeland

## 2014-04-16 ENCOUNTER — Ambulatory Visit (INDEPENDENT_AMBULATORY_CARE_PROVIDER_SITE_OTHER): Payer: Medicare Other | Admitting: Internal Medicine

## 2014-04-16 ENCOUNTER — Ambulatory Visit (INDEPENDENT_AMBULATORY_CARE_PROVIDER_SITE_OTHER)
Admission: RE | Admit: 2014-04-16 | Discharge: 2014-04-16 | Disposition: A | Discharge status: Home / Self Care | Payer: Medicare Other | Source: Ambulatory Visit | Attending: Internal Medicine | Admitting: Internal Medicine

## 2014-04-16 ENCOUNTER — Encounter: Payer: Self-pay | Admitting: Internal Medicine

## 2014-04-16 VITALS — BP 136/72 | HR 67 | Temp 98.5°F | Ht 63.5 in | Wt 141.0 lb

## 2014-04-16 DIAGNOSIS — Z720 Tobacco use: Secondary | ICD-10-CM | POA: Diagnosis not present

## 2014-04-16 DIAGNOSIS — I10 Essential (primary) hypertension: Secondary | ICD-10-CM | POA: Diagnosis not present

## 2014-04-16 DIAGNOSIS — J449 Chronic obstructive pulmonary disease, unspecified: Secondary | ICD-10-CM | POA: Diagnosis not present

## 2014-04-16 DIAGNOSIS — F17200 Nicotine dependence, unspecified, uncomplicated: Secondary | ICD-10-CM

## 2014-04-16 DIAGNOSIS — J929 Pleural plaque without asbestos: Secondary | ICD-10-CM | POA: Diagnosis not present

## 2014-04-16 NOTE — Patient Instructions (Signed)
See calendar for specific medication instructions and bring it back for each and every office visit for every healthcare provider you see.  Without it,  you may not receive the best quality medical care that we feel you deserve.  You will note that the calendar groups together  your maintenance  medications that are timed at particular times of the day.  Think of this as your checklist for what your doctor has instructed you to do until your next evaluation to see what benefit  there is  to staying on a consistent group of medications intended to keep you well.  The other group at the bottom is entirely up to you to use as you see fit  for specific symptoms that may arise between visits that require you to treat them on an as needed basis.  Think of this as your action plan or "what if" list.   Separating the top medications from the bottom group is fundamental to providing you adequate care going forward.   The key is to stop smoking completely before smoking completely stops you!    If you are satisfied with your treatment plan,  let your doctor know and he/she can either refill your medications or you can return here when your prescription runs out.     If in any way you are not 100% satisfied,  please tell us.  If 100% better, tell your friends!  Pulmonary follow up is as needed

## 2014-04-16 NOTE — Progress Notes (Signed)
Subjective:    Patient ID: Brandy Copeland, female    DOB: 08-20-1945,  MRN: [id number]    Brief patient profile:  65 yobf smoker referred by Dr Johnsie Cancel for copd evaluation and proved to have GOLD Copeland critiera 01/2014    Primary [place]    History of Present Illness  11/19/2013 1st [facility] office visit/ Brandy Copeland / on ACEi / still smoking  Chief Complaint  Patient presents with  . Pulmonary Consult    referred by Dr. Johnsie Cancel.  SOB and dry cough.     Constant sensation of throat drainage/ choking 24/7 with variably productive cough x one year  and doe x maybe a hundred feet some better p neb avg 3 x daily.  Coughs to point of vomiting each am / otherwise no sputum production, even in am. rec Stop corevidol and start bystolic 10 mg one daily in its place Stop lisinopril and take valsartan 80 mg one daily in its place Prednisone 10 mg take  4 each am x 2 days,   2 each am x 2 days,  1 each am x 2 days and stop Ok to use your nebulizer up to [employer] 4 hours as needed for breathing  The key is to stop smoking completely before smoking completely stops you!   01/30/2014 f/u ov/Brandy Copeland re:  GOLD Copeland copd / still smoking  Chief Complaint  Patient presents with  . Follow-up    patient had pft today: Patient has been having sob, wheezing and chest tightness.    No saba in several weeks/ no cough, not really limited by doe at this point  - has neb not using  >>d/c ACEi    02/05/14 follow up /COPD Brandy Copeland /smoker  Patient returns for one-week follow-up medication review. Reports that her breathing is doing okay. She denies any chest pain, shortness breath, orthopnea, PND, or leg swelling We reviewed all medications organize them into a medication count with patient education Brought both coreg and bisoprolol bottles ,was suppose to have stopped coreg. Unclear if taking both.   She is still smoking. We discussed smoking cessation. Last ov ACE inhibitor stopped . Does feel cough is better.    rec Bring CD of your most recent chest xray to next office visit.  Follow med calendar closely and bring to each visit.  Stop Coreg .  Work on not smoking  Increase Duoneb Three times daily .    03/05/2014 f/u ov/Brandy Copeland re: copd GOLD Copeland still smoking / rx = duoneb only / using med calendar well  Chief Complaint  Patient presents with  . Follow-up    Pt states that her breathing has improved some since her last visit. She still has minimal wheezing. No new co's today.   walking with walker nad/ no 02 on duoneb tid no noct complaints  Very sedentary, can go sev hundred feet s stopping rec Ok to just use the duoneb automatically in am and at bedtime but up to 4x daily if breathing not good as per your med calendar Late add :  F/u ov final visit with cxr due 6 weeks    04/16/2014 f/u ov/Brandy Copeland re: GOLD Copeland  Copd / still smoking / following med calendar well Chief Complaint  Patient presents with  . Follow-up    pt c/o sometimes prod cough with white mucus, sob worse in mornings.      Sputtering / congestion in am x sev tbsp mucoid sputum, never bloody,  and doesn't  get around to neb until close to noon  - skips the middle doses - breathing better p nebs and still very sedentary/ uses rolling walker to get around, can only go about 100 ft on best days   No obvious day to day or daytime variabilty or assoc  cp or chest tightness, subjective wheeze overt sinus or hb symptoms. No unusual exp hx or h/o childhood pna/ asthma or knowledge of premature birth.  Sleeping ok without nocturnal  or early am exacerbation  of respiratory  c/o's or need for noct saba. Also denies any obvious fluctuation of symptoms with weather or environmental changes or other aggravating or alleviating factors except as outlined above   Current Medications, Allergies, Complete Past Medical History, Past Surgical History, Family History, and Social History were reviewed in [employer] record.  ROS   The following are not active complaints unless bolded sore throat, dysphagia, dental problems, itching, sneezing,  nasal congestion or excess/ purulent secretions, ear ache,   fever, chills, sweats, unintended wt loss, pleuritic or exertional cp, hemoptysis,  orthopnea pnd or leg swelling, presyncope, palpitations, heartburn, abdominal pain, anorexia, nausea, vomiting, diarrhea  or change in bowel or urinary habits, change in stools or urine, dysuria,hematuria,  rash, arthralgias, visual complaints, headache, numbness weakness or ataxia or problems with walking or coordination,  change in mood/affect or memory.                               Objective:   Physical Exam  amb bf nad with rolling walker  01/30/2014    138 >141 02/07/2014  > 03/05/2014  142 > 04/16/2014 141  HEENT mild turbinate edema.  Oropharynx no thrush or excess pnd or cobblestoning.  No JVD or cervical adenopathy. Mild accessory muscle hypertrophy. Trachea midline, nl thryroid. Chest was hyperinflated by percussion with diminished breath sounds and moderate increased exp time without wheeze. Hoover sign positive at mid inspiration. Regular rate and rhythm without murmur gallop or rub or increase P2 or edema.  Abd: no hsm, nl excursion. Ext warm without cyanosis or clubbing.       CXR PA and Lateral:   04/16/2014 :      I personally reviewed images and agree with radiology impression as follows:   1. Lingular pleural parenchymal thickening consistent with scarring. 2. No acute cardiopulmonary disease PA chest is stable from prior exams.      Lab Results  Component Value Date   PROBNP 441.9* 12/29/2012     Lab Results  Component Value Date   WBC 11.5* 01/04/2013   HGB 14.7 01/04/2013   HCT 42.5 01/04/2013   MCV 94.7 01/04/2013   PLT 321 01/04/2013       Chemistry      Component Value Date/Time   NA 134* 02/23/2013 1154   K 3.8 02/23/2013 1154   CL 100 02/23/2013 1154   CO2 23 02/23/2013 1154   BUN 15  02/23/2013 1154   CREATININE 0.8 02/23/2013 1154      Component Value Date/Time   CALCIUM 9.3 02/23/2013 1154   ALKPHOS 68 03/22/2013 0921   AST 15 03/22/2013 0921   ALT 12 03/22/2013 0921   BILITOT 0.1* 03/22/2013 0921          Assessment & Plan:

## 2014-04-18 NOTE — Assessment & Plan Note (Signed)
D/c coreg 11/19/2013 due to bronchospasm on exam > bystolic 10 mg x 4 week trial  D/c acei 01/30/2014 due to psuedowheeze>diovan 80 mg daily x 4 week trial  Adequate control on present rx, reviewed > no change in rx needed  > avoiding  acei and non-specific BB

## 2014-04-18 NOTE — Assessment & Plan Note (Signed)
>   3 min discussion I reviewed the Flethcher curve with patient that basically indicates  if you quit smoking when your best day FEV1 is still relatively well preserved (as is still the case here)  it is highly unlikely you will progress to severe disease and informed the patient there was no medication on the market that has proven to change the curve or the likelihood of progression.  Therefore stopping smoking and maintaining abstinence is the most important aspect of care, not choice of inhalers or for that matter, doctors.

## 2014-04-18 NOTE — Assessment & Plan Note (Signed)
-   trial off  coreg 11/19/2013 >   - PFT's 01/30/14 FEV1  1.01 (54%) ratio 60 and no change p saba/ DLCO 55 corrects to 82%  - ACEi stopped 01/31/2014   She has mod sever copd which would probably bother her more if she were more active. As it is, she isn't even using the duoneb but 50% of the time so unless does worse or needs more duoneb would not change rx  The key to her longterm outcome is stopping smoking (see sep a/p)    Each maintenance medication was reviewed in detail including most importantly the difference between maintenance and as needed and under what circumstances the prns are to be used. This was done in the context of a medication calendar review which provided the patient with a user-friendly unambiguous mechanism for medication administration and reconciliation and provides an action plan for all active problems. It is critical that this be shown to every doctor  for modification during the office visit if necessary so the patient can use it as a working document.

## 2014-04-21 DIAGNOSIS — J449 Chronic obstructive pulmonary disease, unspecified: Secondary | ICD-10-CM | POA: Diagnosis not present

## 2014-04-21 DIAGNOSIS — R32 Unspecified urinary incontinence: Secondary | ICD-10-CM | POA: Diagnosis not present

## 2014-04-30 DIAGNOSIS — R32 Unspecified urinary incontinence: Secondary | ICD-10-CM | POA: Diagnosis not present

## 2014-04-30 DIAGNOSIS — M1990 Unspecified osteoarthritis, unspecified site: Secondary | ICD-10-CM | POA: Diagnosis not present

## 2014-05-07 DIAGNOSIS — R32 Unspecified urinary incontinence: Secondary | ICD-10-CM | POA: Diagnosis not present

## 2014-05-13 NOTE — Progress Notes (Signed)
Patient ID: Brandy Copeland, female   DOB: 1945/12/11, 69 y.o.   MRN: [id number] 69 y.o.  f/u for CAD Cath done end of 9/14 Rx with plavix and imdur Limited by severe COPD  Indication: SEMI duirng COPD exacerbation  Procedural Details: The right wrist was prepped, draped, and anesthetized with 1% lidocaine. Using the modified Seldinger technique, a 5 French sheath was introduced into the right radial artery. 3 mg of verapamil was administered through the sheath, weight-based unfractionated heparin was administered intravenously. Standard Judkins catheters were used for selective coronary angiography and left ventriculography. Catheter exchanges were performed over an exchange length guidewire. There were no immediate procedural complications. A TR band was used for radial hemostasis at the completion of the procedure. The patient was transferred to the post catheterization recovery area for further monitoring.  Procedural Findings:  Hemodynamics:  AO 154 80  LV 152 15 mmHg  Coronary angiography:  Coronary dominance: right  Left mainstem: Calcified 30% mid  Left anterior descending (LAD): 40% proximal 60% mid and 70% distal  IM: Large vessel With bifurcation 70% tortuous disease in smaller superior branch  Left circumflex (LCx): 40% ostial 70% mid vessel disease at take off of two small OM brances 30% distal disease  Right coronary artery (RCA): 60% proximal, 60-70% mid 50% distal  PDA: 40% ostial  Left ventriculography: Left ventricular systolic function is normal, LVEF is estimated at 55-65%, there is no significant mitral regurgitation  Impression: Will ask Dr Martinique to review. Mid RCA is tightest approachable lesion. The mid circumflex and superior branch of IM would be  Difficult to stent. May be that medical Rx is best to start since she has no chest pain and is severely limited by smoking and COPD   No chest pain Needs nitro Counseled on smoking cessation No wheezing  Seeing Dr Melvyn Novas for her  lung issues     ROS: Denies fever, malais, weight loss, blurry vision, decreased visual acuity, cough, sputum, SOB, hemoptysis, pleuritic pain, palpitaitons, heartburn, abdominal pain, melena, lower extremity edema, claudication, or rash.  All other systems reviewed and negative  General: Affect appropriate Chronically ill black female with walker  HEENT: normal Neck supple with no adenopathy JVP normal no bruits no thyromegaly Lungs poor air movement  no wheezing and good diaphragmatic motion Heart:  S1/S2 3/6 SEM  murmur, no rub, gallop or click PMI normal Abdomen: benighn, BS positve, no tenderness, no AAA no bruit.  No HSM or HJR Distal pulses intact with no bruits No edema Neuro non-focal Skin warm and dry No muscular weakness   Current Outpatient Prescriptions  Medication Sig Dispense Refill  . aspirin EC 81 MG tablet Take 1 tablet (81 mg total) by mouth daily. 30 tablet 11  . bisoprolol (ZEBETA) 5 MG tablet Take 1 tablet (5 mg total) by mouth daily. 30 tablet 2  . clopidogrel (PLAVIX) 75 MG tablet Take 1 tablet (75 mg total) by mouth daily with breakfast. 30 tablet 11  . dextromethorphan-guaiFENesin (MUCINEX DM) 30-600 MG per 12 hr tablet Take 1 tablet by mouth 2 (two) times daily as needed for cough.    [name] ipratropium-albuterol (DUONEB) 0.5-2.5 (3) MG/3ML SOLN Take 3 mLs by nebulization 3 (three) times daily.    . isosorbide mononitrate (ISMO,MONOKET) 20 MG tablet Take 1/2 tab by mouth every morning  99  . lovastatin (MEVACOR) 20 MG tablet Take 1 tablet (20 mg total) by mouth at bedtime. 30 tablet 2  . nabumetone (RELAFEN) 750 MG tablet Take  750 mg by mouth daily.  1  . NITROSTAT 0.4 MG SL tablet 1 TABLET UNDER TONGUE IF NEEDED EVERY 5 MINUTES FOR CHEST FOR PAIN FOR 3 DOSES IF NO RELIEF AFTER 3RD DOSE CALL 911. 25 tablet 0  . oxybutynin (DITROPAN-XL) 5 MG 24 hr tablet Take 5 mg by mouth at bedtime.    . traMADol (ULTRAM) 50 MG tablet Take 50 mg by mouth every morning.      . valsartan (DIOVAN) 80 MG tablet 2 daily 60 tablet 3   No current facility-administered medications for this visit.    Allergies  Hctz  Electrocardiogram:  11/14  SR rate 90 LAD   Assessment and Plan

## 2014-05-14 ENCOUNTER — Encounter: Payer: Medicare Other | Admitting: Cardiovascular Disease

## 2014-05-15 ENCOUNTER — Telehealth: Payer: Self-pay | Admitting: Internal Medicine

## 2014-05-15 DIAGNOSIS — R32 Unspecified urinary incontinence: Secondary | ICD-10-CM | POA: Diagnosis not present

## 2014-05-15 MED ORDER — VALSARTAN 80 MG PO TABS: 80.0000 mg | ORAL_TABLET | Freq: Two times a day (BID) | ORAL | Status: DC

## 2014-05-15 NOTE — Telephone Encounter (Signed)
Spoke with pt, needs valsartan refilled to [facility].  Nothing further needed.

## 2014-05-20 ENCOUNTER — Other Ambulatory Visit: Payer: Self-pay | Admitting: Cardiovascular Disease

## 2014-05-20 NOTE — Telephone Encounter (Signed)
NEEDS FOLLOW UP APPT FOR REFILLS

## 2014-05-22 DIAGNOSIS — J449 Chronic obstructive pulmonary disease, unspecified: Secondary | ICD-10-CM | POA: Diagnosis not present

## 2014-05-22 DIAGNOSIS — R32 Unspecified urinary incontinence: Secondary | ICD-10-CM | POA: Diagnosis not present

## 2014-05-27 DIAGNOSIS — J449 Chronic obstructive pulmonary disease, unspecified: Secondary | ICD-10-CM | POA: Diagnosis not present

## 2014-05-27 DIAGNOSIS — E784 Other hyperlipidemia: Secondary | ICD-10-CM | POA: Diagnosis not present

## 2014-05-27 DIAGNOSIS — I10 Essential (primary) hypertension: Secondary | ICD-10-CM | POA: Diagnosis not present

## 2014-05-27 DIAGNOSIS — E785 Hyperlipidemia, unspecified: Secondary | ICD-10-CM | POA: Diagnosis not present

## 2014-05-28 DIAGNOSIS — R32 Unspecified urinary incontinence: Secondary | ICD-10-CM | POA: Diagnosis not present

## 2014-05-28 DIAGNOSIS — I10 Essential (primary) hypertension: Secondary | ICD-10-CM | POA: Diagnosis not present

## 2014-05-29 DIAGNOSIS — I10 Essential (primary) hypertension: Secondary | ICD-10-CM | POA: Diagnosis not present

## 2014-05-29 DIAGNOSIS — E785 Hyperlipidemia, unspecified: Secondary | ICD-10-CM | POA: Diagnosis not present

## 2014-05-29 DIAGNOSIS — J449 Chronic obstructive pulmonary disease, unspecified: Secondary | ICD-10-CM | POA: Diagnosis not present

## 2014-05-29 DIAGNOSIS — E784 Other hyperlipidemia: Secondary | ICD-10-CM | POA: Diagnosis not present

## 2014-05-31 DIAGNOSIS — J449 Chronic obstructive pulmonary disease, unspecified: Secondary | ICD-10-CM | POA: Diagnosis not present

## 2014-05-31 DIAGNOSIS — E785 Hyperlipidemia, unspecified: Secondary | ICD-10-CM | POA: Diagnosis not present

## 2014-05-31 DIAGNOSIS — E784 Other hyperlipidemia: Secondary | ICD-10-CM | POA: Diagnosis not present

## 2014-05-31 DIAGNOSIS — I10 Essential (primary) hypertension: Secondary | ICD-10-CM | POA: Diagnosis not present

## 2014-06-03 ENCOUNTER — Other Ambulatory Visit: Payer: Self-pay | Admitting: Cardiovascular Disease

## 2014-06-03 DIAGNOSIS — J449 Chronic obstructive pulmonary disease, unspecified: Secondary | ICD-10-CM | POA: Diagnosis not present

## 2014-06-03 DIAGNOSIS — E785 Hyperlipidemia, unspecified: Secondary | ICD-10-CM | POA: Diagnosis not present

## 2014-06-03 DIAGNOSIS — E784 Other hyperlipidemia: Secondary | ICD-10-CM | POA: Diagnosis not present

## 2014-06-03 DIAGNOSIS — I10 Essential (primary) hypertension: Secondary | ICD-10-CM | POA: Diagnosis not present

## 2014-06-03 NOTE — Progress Notes (Signed)
Patient ID: Brandy Copeland, female   DOB: 1945-12-30, 69 y.o.   MRN: [id number] 69 y.o.  f/u for CAD Cath done end of 9/14 Rx with plavix and imdur Limited by severe COPD  Had moderate disease in IM/circumflex that would be hard to stent And mid RCA more approachable  Felt by Dr Martinique that initial medical Rx warranted and she has done well with ths  Indication: SEMI duirng COPD exacerbation  Procedural Details: The right wrist was prepped, draped, and anesthetized with 1% lidocaine. Using the modified Seldinger technique, a 5 French sheath was introduced into the right radial artery. 3 mg of verapamil was administered through the sheath, weight-based unfractionated heparin was administered intravenously. Standard Judkins catheters were used for selective coronary angiography and left ventriculography. Catheter exchanges were performed over an exchange length guidewire. There were no immediate procedural complications. A TR band was used for radial hemostasis at the completion of the procedure. The patient was transferred to the post catheterization recovery area for further monitoring.  Procedural Findings:  Hemodynamics:  AO 154 80  LV 152 15 mmHg  Coronary angiography:  Coronary dominance: right  Left mainstem: Calcified 30% mid  Left anterior descending (LAD): 40% proximal 60% mid and 70% distal  IM: Large vessel With bifurcation 70% tortuous disease in smaller superior branch  Left circumflex (LCx): 40% ostial 70% mid vessel disease at take off of two small OM brances 30% distal disease  Right coronary artery (RCA): 60% proximal, 60-70% mid 50% distal  PDA: 40% ostial  Left ventriculography: Left ventricular systolic function is normal, LVEF is estimated at 55-65%, there is no significant mitral regurgitation  Impression: Will ask Dr Martinique to review. Mid RCA is tightest approachable lesion. The mid circumflex and superior branch of IM would be  Difficult to stent. May be that medical Rx is  best to start since she has no chest pain and is severely limited by smoking and COPD   No chest pain Needs nitro Counseled on smoking cessation No wheezing  Needs to f/u with our lung doctors   ROS: Denies fever, malais, weight loss, blurry vision, decreased visual acuity, cough, sputum, SOB, hemoptysis, pleuritic pain, palpitaitons, heartburn, abdominal pain, melena, lower extremity edema, claudication, or rash.  All other systems reviewed and negative  General: Affect appropriate Chronically ill black female with walker  HEENT: normal Neck supple with no adenopathy JVP normal no bruits no thyromegaly Lungs poor air movement  no wheezing and good diaphragmatic motion Heart:  S1/S2 3/6 SEM  murmur, no rub, gallop or click PMI normal Abdomen: benighn, BS positve, no tenderness, no AAA no bruit.  No HSM or HJR Distal pulses intact with no bruits No edema Neuro non-focal Skin warm and dry No muscular weakness   Current Outpatient Prescriptions  Medication Sig Dispense Refill  . aspirin EC 81 MG tablet Take 1 tablet (81 mg total) by mouth daily. 30 tablet 11  . bisoprolol (ZEBETA) 5 MG tablet Take 1 tablet (5 mg total) by mouth daily. 30 tablet 2  . clopidogrel (PLAVIX) 75 MG tablet Take 1 tablet (75 mg total) by mouth daily with breakfast. 30 tablet 11  . dextromethorphan-guaiFENesin (MUCINEX DM) 30-600 MG per 12 hr tablet Take 1 tablet by mouth 2 (two) times daily as needed for cough.    [name] ipratropium-albuterol (DUONEB) 0.5-2.5 (3) MG/3ML SOLN Take 3 mLs by nebulization 3 (three) times daily.    . isosorbide mononitrate (ISMO,MONOKET) 20 MG tablet Take 1/2 tab by mouth  every morning  99  . lovastatin (MEVACOR) 20 MG tablet Take 1 tablet (20 mg total) by mouth at bedtime. 30 tablet 2  . nabumetone (RELAFEN) 750 MG tablet Take 750 mg by mouth daily.  1  . NITROSTAT 0.4 MG SL tablet 1 TABLET UNDER TONGUE IF NEEDED EVERY 5 MINUTES FOR CHEST FOR PAIN FOR 3 DOSES IF NO RELIEF AFTER 3RD  DOSE CALL 911. 25 tablet 0  . oxybutynin (DITROPAN-XL) 5 MG 24 hr tablet Take 5 mg by mouth at bedtime.    . traMADol (ULTRAM) 50 MG tablet Take 50 mg by mouth every morning.     . valsartan (DIOVAN) 80 MG tablet Take 1 tablet (80 mg total) by mouth 2 (two) times daily. 2 daily 60 tablet 3   No current facility-administered medications for this visit.    Allergies  Hctz  Electrocardiogram:  11/14  SR rate 90 LAD   06/04/14 SR rate 65 normal   Assessment and Plan

## 2014-06-04 ENCOUNTER — Ambulatory Visit (INDEPENDENT_AMBULATORY_CARE_PROVIDER_SITE_OTHER): Payer: Medicare Other | Admitting: Cardiovascular Disease

## 2014-06-04 ENCOUNTER — Other Ambulatory Visit: Payer: Self-pay | Admitting: Physician Assistant

## 2014-06-04 ENCOUNTER — Encounter: Payer: Self-pay | Admitting: Cardiovascular Disease

## 2014-06-04 VITALS — BP 184/86 | HR 65 | Ht 63.5 in | Wt 144.0 lb

## 2014-06-04 DIAGNOSIS — R011 Cardiac murmur, unspecified: Secondary | ICD-10-CM

## 2014-06-04 MED ORDER — BISOPROLOL FUMARATE 10 MG PO TABS: 10.0000 mg | ORAL_TABLET | Freq: Every day | ORAL | Status: DC

## 2014-06-04 MED ORDER — VALSARTAN 160 MG PO TABS: 160.0000 mg | ORAL_TABLET | Freq: Two times a day (BID) | ORAL | Status: AC

## 2014-06-04 NOTE — Assessment & Plan Note (Signed)
Increase zybeta and diovan f/u next available

## 2014-06-04 NOTE — Assessment & Plan Note (Signed)
F/U echo mild AS murmur

## 2014-06-04 NOTE — Assessment & Plan Note (Signed)
Cholesterol is at goal.  Continue current dose of statin and diet Rx.  No myalgias or side effects.  F/U  LFT's in 6 months. Lab Results  Component Value Date   LDLCALC 84 03/22/2013

## 2014-06-04 NOTE — Assessment & Plan Note (Signed)
Stable with no angina and good activity level.  Continue medical Rx Need to have BP lower to prevent angina

## 2014-06-04 NOTE — Telephone Encounter (Signed)
Not on med list from patients visit today, and it looks like it was d'cd by pulmonologist. Please advise. Thanks, MI

## 2014-06-04 NOTE — Assessment & Plan Note (Signed)
F/U [facility]  No active wheezing Discussed smoking cessation and effects of nicotine on BP and CAD  No motivation to quit F/U pulmonary for pneumonia shot

## 2014-06-04 NOTE — Patient Instructions (Addendum)
Your physician recommends that you schedule a follow-up appointment in:  Brandy Copeland has recommended you make the following change in your medication:  INCREASE  DIOVAN  TO  160 MG  TWICE   A  DAY  INCREASE BISOPROLOL  TO  10 MG 1  TAB  EVERY DAY   Your physician has requested that you have an echocardiogram. Echocardiography is a painless test that uses sound waves to create images of your heart. It provides your doctor with information about the size and shape of your heart and how well your heart's chambers and valves are working. This procedure takes approximately one hour. There are no restrictions for this procedure.

## 2014-06-05 DIAGNOSIS — J449 Chronic obstructive pulmonary disease, unspecified: Secondary | ICD-10-CM | POA: Diagnosis not present

## 2014-06-05 DIAGNOSIS — I10 Essential (primary) hypertension: Secondary | ICD-10-CM | POA: Diagnosis not present

## 2014-06-05 DIAGNOSIS — E784 Other hyperlipidemia: Secondary | ICD-10-CM | POA: Diagnosis not present

## 2014-06-05 DIAGNOSIS — E785 Hyperlipidemia, unspecified: Secondary | ICD-10-CM | POA: Diagnosis not present

## 2014-06-07 DIAGNOSIS — E784 Other hyperlipidemia: Secondary | ICD-10-CM | POA: Diagnosis not present

## 2014-06-07 DIAGNOSIS — I10 Essential (primary) hypertension: Secondary | ICD-10-CM | POA: Diagnosis not present

## 2014-06-07 DIAGNOSIS — J449 Chronic obstructive pulmonary disease, unspecified: Secondary | ICD-10-CM | POA: Diagnosis not present

## 2014-06-07 DIAGNOSIS — E785 Hyperlipidemia, unspecified: Secondary | ICD-10-CM | POA: Diagnosis not present

## 2014-06-10 DIAGNOSIS — J449 Chronic obstructive pulmonary disease, unspecified: Secondary | ICD-10-CM | POA: Diagnosis not present

## 2014-06-10 DIAGNOSIS — I10 Essential (primary) hypertension: Secondary | ICD-10-CM | POA: Diagnosis not present

## 2014-06-10 DIAGNOSIS — E785 Hyperlipidemia, unspecified: Secondary | ICD-10-CM | POA: Diagnosis not present

## 2014-06-10 DIAGNOSIS — E784 Other hyperlipidemia: Secondary | ICD-10-CM | POA: Diagnosis not present

## 2014-06-11 ENCOUNTER — Ambulatory Visit (HOSPITAL_COMMUNITY): Payer: Medicare Other | Attending: Cardiology

## 2014-06-11 DIAGNOSIS — R011 Cardiac murmur, unspecified: Secondary | ICD-10-CM | POA: Diagnosis not present

## 2014-06-11 NOTE — Progress Notes (Signed)
2D Echo completed. 06/11/2014

## 2014-06-12 DIAGNOSIS — E785 Hyperlipidemia, unspecified: Secondary | ICD-10-CM | POA: Diagnosis not present

## 2014-06-12 DIAGNOSIS — E784 Other hyperlipidemia: Secondary | ICD-10-CM | POA: Diagnosis not present

## 2014-06-12 DIAGNOSIS — J449 Chronic obstructive pulmonary disease, unspecified: Secondary | ICD-10-CM | POA: Diagnosis not present

## 2014-06-12 DIAGNOSIS — I10 Essential (primary) hypertension: Secondary | ICD-10-CM | POA: Diagnosis not present

## 2014-06-14 DIAGNOSIS — I10 Essential (primary) hypertension: Secondary | ICD-10-CM | POA: Diagnosis not present

## 2014-06-14 DIAGNOSIS — E785 Hyperlipidemia, unspecified: Secondary | ICD-10-CM | POA: Diagnosis not present

## 2014-06-14 DIAGNOSIS — J449 Chronic obstructive pulmonary disease, unspecified: Secondary | ICD-10-CM | POA: Diagnosis not present

## 2014-06-14 DIAGNOSIS — E784 Other hyperlipidemia: Secondary | ICD-10-CM | POA: Diagnosis not present

## 2014-06-17 DIAGNOSIS — I10 Essential (primary) hypertension: Secondary | ICD-10-CM | POA: Diagnosis not present

## 2014-06-17 DIAGNOSIS — E784 Other hyperlipidemia: Secondary | ICD-10-CM | POA: Diagnosis not present

## 2014-06-17 DIAGNOSIS — E785 Hyperlipidemia, unspecified: Secondary | ICD-10-CM | POA: Diagnosis not present

## 2014-06-17 DIAGNOSIS — J449 Chronic obstructive pulmonary disease, unspecified: Secondary | ICD-10-CM | POA: Diagnosis not present

## 2014-06-19 DIAGNOSIS — E785 Hyperlipidemia, unspecified: Secondary | ICD-10-CM | POA: Diagnosis not present

## 2014-06-19 DIAGNOSIS — E784 Other hyperlipidemia: Secondary | ICD-10-CM | POA: Diagnosis not present

## 2014-06-19 DIAGNOSIS — I10 Essential (primary) hypertension: Secondary | ICD-10-CM | POA: Diagnosis not present

## 2014-06-19 DIAGNOSIS — J449 Chronic obstructive pulmonary disease, unspecified: Secondary | ICD-10-CM | POA: Diagnosis not present

## 2014-06-20 DIAGNOSIS — R32 Unspecified urinary incontinence: Secondary | ICD-10-CM | POA: Diagnosis not present

## 2014-06-20 DIAGNOSIS — J449 Chronic obstructive pulmonary disease, unspecified: Secondary | ICD-10-CM | POA: Diagnosis not present

## 2014-06-21 DIAGNOSIS — E784 Other hyperlipidemia: Secondary | ICD-10-CM | POA: Diagnosis not present

## 2014-06-21 DIAGNOSIS — J449 Chronic obstructive pulmonary disease, unspecified: Secondary | ICD-10-CM | POA: Diagnosis not present

## 2014-06-21 DIAGNOSIS — I10 Essential (primary) hypertension: Secondary | ICD-10-CM | POA: Diagnosis not present

## 2014-06-21 DIAGNOSIS — E785 Hyperlipidemia, unspecified: Secondary | ICD-10-CM | POA: Diagnosis not present

## 2014-06-24 DIAGNOSIS — I10 Essential (primary) hypertension: Secondary | ICD-10-CM | POA: Diagnosis not present

## 2014-06-24 DIAGNOSIS — E785 Hyperlipidemia, unspecified: Secondary | ICD-10-CM | POA: Diagnosis not present

## 2014-06-24 DIAGNOSIS — E784 Other hyperlipidemia: Secondary | ICD-10-CM | POA: Diagnosis not present

## 2014-06-24 DIAGNOSIS — J449 Chronic obstructive pulmonary disease, unspecified: Secondary | ICD-10-CM | POA: Diagnosis not present

## 2014-06-25 DIAGNOSIS — R32 Unspecified urinary incontinence: Secondary | ICD-10-CM | POA: Diagnosis not present

## 2014-06-25 DIAGNOSIS — I10 Essential (primary) hypertension: Secondary | ICD-10-CM | POA: Diagnosis not present

## 2014-06-25 DIAGNOSIS — B029 Zoster without complications: Secondary | ICD-10-CM | POA: Diagnosis not present

## 2014-06-26 DIAGNOSIS — I10 Essential (primary) hypertension: Secondary | ICD-10-CM | POA: Diagnosis not present

## 2014-06-26 DIAGNOSIS — E784 Other hyperlipidemia: Secondary | ICD-10-CM | POA: Diagnosis not present

## 2014-06-26 DIAGNOSIS — J449 Chronic obstructive pulmonary disease, unspecified: Secondary | ICD-10-CM | POA: Diagnosis not present

## 2014-06-26 DIAGNOSIS — E785 Hyperlipidemia, unspecified: Secondary | ICD-10-CM | POA: Diagnosis not present

## 2014-06-27 ENCOUNTER — Other Ambulatory Visit: Payer: Self-pay | Admitting: Internal Medicine

## 2014-07-01 DIAGNOSIS — E785 Hyperlipidemia, unspecified: Secondary | ICD-10-CM | POA: Diagnosis not present

## 2014-07-01 DIAGNOSIS — J449 Chronic obstructive pulmonary disease, unspecified: Secondary | ICD-10-CM | POA: Diagnosis not present

## 2014-07-01 DIAGNOSIS — E784 Other hyperlipidemia: Secondary | ICD-10-CM | POA: Diagnosis not present

## 2014-07-01 DIAGNOSIS — I10 Essential (primary) hypertension: Secondary | ICD-10-CM | POA: Diagnosis not present

## 2014-07-02 ENCOUNTER — Emergency Department (HOSPITAL_COMMUNITY): Payer: Medicare Other

## 2014-07-02 ENCOUNTER — Encounter (HOSPITAL_COMMUNITY): Payer: Self-pay | Admitting: Emergency Medicine

## 2014-07-02 ENCOUNTER — Inpatient Hospital Stay (HOSPITAL_COMMUNITY)
Admission: EM | Admit: 2014-07-02 | Discharge: 2014-07-06 | DRG: 190 | Disposition: A | Discharge status: Home / Self Care | Payer: Medicare Other | Attending: Internal Medicine | Admitting: Internal Medicine

## 2014-07-02 DIAGNOSIS — F17210 Nicotine dependence, cigarettes, uncomplicated: Secondary | ICD-10-CM | POA: Diagnosis not present

## 2014-07-02 DIAGNOSIS — R0602 Shortness of breath: Secondary | ICD-10-CM

## 2014-07-02 DIAGNOSIS — J9601 Acute respiratory failure with hypoxia: Secondary | ICD-10-CM | POA: Diagnosis present

## 2014-07-02 DIAGNOSIS — Z7982 Long term (current) use of aspirin: Secondary | ICD-10-CM

## 2014-07-02 DIAGNOSIS — I214 Non-ST elevation (NSTEMI) myocardial infarction: Secondary | ICD-10-CM | POA: Diagnosis present

## 2014-07-02 DIAGNOSIS — J9600 Acute respiratory failure, unspecified whether with hypoxia or hypercapnia: Secondary | ICD-10-CM | POA: Insufficient documentation

## 2014-07-02 DIAGNOSIS — I2510 Atherosclerotic heart disease of native coronary artery without angina pectoris: Secondary | ICD-10-CM | POA: Diagnosis present

## 2014-07-02 DIAGNOSIS — J9602 Acute respiratory failure with hypercapnia: Secondary | ICD-10-CM | POA: Diagnosis present

## 2014-07-02 DIAGNOSIS — I248 Other forms of acute ischemic heart disease: Secondary | ICD-10-CM | POA: Diagnosis not present

## 2014-07-02 DIAGNOSIS — R778 Other specified abnormalities of plasma proteins: Secondary | ICD-10-CM | POA: Insufficient documentation

## 2014-07-02 DIAGNOSIS — D649 Anemia, unspecified: Secondary | ICD-10-CM | POA: Diagnosis present

## 2014-07-02 DIAGNOSIS — I252 Old myocardial infarction: Secondary | ICD-10-CM | POA: Diagnosis not present

## 2014-07-02 DIAGNOSIS — Z7902 Long term (current) use of antithrombotics/antiplatelets: Secondary | ICD-10-CM

## 2014-07-02 DIAGNOSIS — Z888 Allergy status to other drugs, medicaments and biological substances status: Secondary | ICD-10-CM | POA: Diagnosis not present

## 2014-07-02 DIAGNOSIS — Z87442 Personal history of urinary calculi: Secondary | ICD-10-CM

## 2014-07-02 DIAGNOSIS — I10 Essential (primary) hypertension: Secondary | ICD-10-CM | POA: Diagnosis present

## 2014-07-02 DIAGNOSIS — R069 Unspecified abnormalities of breathing: Secondary | ICD-10-CM | POA: Diagnosis not present

## 2014-07-02 DIAGNOSIS — E785 Hyperlipidemia, unspecified: Secondary | ICD-10-CM | POA: Diagnosis not present

## 2014-07-02 DIAGNOSIS — R739 Hyperglycemia, unspecified: Secondary | ICD-10-CM

## 2014-07-02 DIAGNOSIS — Z9861 Coronary angioplasty status: Secondary | ICD-10-CM

## 2014-07-02 DIAGNOSIS — J441 Chronic obstructive pulmonary disease with (acute) exacerbation: Secondary | ICD-10-CM | POA: Diagnosis not present

## 2014-07-02 DIAGNOSIS — Z716 Tobacco abuse counseling: Secondary | ICD-10-CM | POA: Diagnosis not present

## 2014-07-02 DIAGNOSIS — M1990 Unspecified osteoarthritis, unspecified site: Secondary | ICD-10-CM | POA: Diagnosis not present

## 2014-07-02 DIAGNOSIS — R7989 Other specified abnormal findings of blood chemistry: Secondary | ICD-10-CM | POA: Diagnosis not present

## 2014-07-02 DIAGNOSIS — R9431 Abnormal electrocardiogram [ECG] [EKG]: Secondary | ICD-10-CM | POA: Diagnosis not present

## 2014-07-02 HISTORY — DX: Acute myocardial infarction, unspecified: I21.9

## 2014-07-02 HISTORY — DX: Cardiac murmur, unspecified: R01.1

## 2014-07-02 HISTORY — DX: Reserved for inherently not codable concepts without codable children: IMO0001

## 2014-07-02 LAB — I-STAT ARTERIAL BLOOD GAS, ED
Acid-base deficit: 2 mmol/L (ref 0.0–2.0)
BICARBONATE: 26.9 meq/L — AB (ref 20.0–24.0)
O2 Saturation: 99 %
PO2 ART: 148 mmHg — AB (ref 80.0–100.0)
Patient temperature: 98.6
TCO2: 29 mmol/L (ref 0–100)
pCO2 arterial: 64.5 mmHg (ref 35.0–45.0)
pH, Arterial: 7.228 — ABNORMAL LOW (ref 7.350–7.450)

## 2014-07-02 LAB — CBC WITH DIFFERENTIAL/PLATELET
Basophils Absolute: 0.1 10*3/uL (ref 0.0–0.1)
Basophils Relative: 1 % (ref 0–1)
Eosinophils Absolute: 0.6 10*3/uL (ref 0.0–0.7)
Eosinophils Relative: 7 % — ABNORMAL HIGH (ref 0–5)
HCT: 32.4 % — ABNORMAL LOW (ref 36.0–46.0)
Hemoglobin: 10 g/dL — ABNORMAL LOW (ref 12.0–15.0)
Lymphocytes Relative: 30 % (ref 12–46)
Lymphs Abs: 2.8 10*3/uL (ref 0.7–4.0)
MCH: 24.2 pg — ABNORMAL LOW (ref 26.0–34.0)
MCHC: 30.9 g/dL (ref 30.0–36.0)
MCV: 78.3 fL (ref 78.0–100.0)
Monocytes Absolute: 0.8 10*3/uL (ref 0.1–1.0)
Monocytes Relative: 8 % (ref 3–12)
NEUTROS ABS: 5 10*3/uL (ref 1.7–7.7)
Neutrophils Relative %: 54 % (ref 43–77)
Platelets: 334 10*3/uL (ref 150–400)
RBC: 4.14 MIL/uL (ref 3.87–5.11)
RDW: 19.7 % — ABNORMAL HIGH (ref 11.5–15.5)
WBC: 9.3 10*3/uL (ref 4.0–10.5)

## 2014-07-02 MED ORDER — ALBUTEROL (5 MG/ML) CONTINUOUS INHALATION SOLN: 20.0000 mg | INHALATION_SOLUTION | RESPIRATORY_TRACT | Status: DC

## 2014-07-02 MED ORDER — METHYLPREDNISOLONE SODIUM SUCC 125 MG IJ SOLR
125.0000 mg | INTRAMUSCULAR | Status: AC
  Administered 2014-07-02: 125 mg via INTRAVENOUS
  Filled 2014-07-02: qty 2

## 2014-07-02 MED ORDER — ALBUTEROL (5 MG/ML) CONTINUOUS INHALATION SOLN
10.0000 mg/h | INHALATION_SOLUTION | Freq: Once | RESPIRATORY_TRACT | Status: AC
  Administered 2014-07-02: 10 mg/h via RESPIRATORY_TRACT
  Filled 2014-07-02: qty 20

## 2014-07-02 NOTE — ED Provider Notes (Signed)
CSN: [id number]     Arrival date & time 07/02/14  2251 History  This chart was scribed for Brandy Pert, MD by Delphia Grates, ED Scribe. This patient was seen in room [facility] and the patient's care was started at 10:53 PM    Chief Complaint  Patient presents with  . Respiratory Distress   Patient is a 69 y.o. female presenting with shortness of breath. The history is provided by the EMS personnel. No language interpreter was used.  Shortness of Breath Onset quality:  Gradual Duration:  2 days Progression:  Worsening Relieved by:  Nothing Worsened by:  Nothing tried Ineffective treatments:  Inhaler Associated symptoms: no abdominal pain, no chest pain, no cough, no fever, no headaches, no neck pain, no rash, no sore throat and no vomiting      HPI Comments: Brandy Copeland is a 69 y.o. female, with history of HTN, CAD, and COPD, brought in by ambulance, who presents to the Emergency Department complaining of moderate SOB that worsened in the last ~2 hours. Per EMS, patient has been complaining of SOB for the past couple of days. Upon their arrival they report rales in all bases. Patient has used nebulizer treatment at home without improvement. She denies any chest pain, or any other symptoms at this time. Patient is not on home oxygen. Initial O2 saturation was 83% on RA.   Past Medical History  Diagnosis Date  . Arthritis   . Hypertension   . Kidney stones   . Irregular heart beat   . Coronary artery disease     a. NSTEMI 2/2 demand ischemia 12/2012 in setting of COPD exacerbation => LHC (01/02/13): mLM 30, pLAD 40, mLAD 60, dLAD 70, small superior branch of the IM 70, oCFX 40, mCFX 70, dCFX 30, pRCA 60, mRCA 60-70, dRCA 50, oPDA 40, EF 55-65%. Medical therapy recommended.   [name] Hx of echocardiogram     a. Echo (12/31/12):  Severe LVH, EF 50-55%, normal wall motion, grade 2 diastolic dysfunction, trivial TR  . COPD (chronic obstructive pulmonary disease)   . Hyperlipidemia     Past Surgical History  Procedure Laterality Date  . Breast biopsy    . Left heart catheterization with coronary angiogram N/A 01/02/2013    Procedure: LEFT HEART CATHETERIZATION WITH CORONARY ANGIOGRAM;  Surgeon: Josue Hector, MD;  Location: [facility] CATH LAB;  Service: Cardiovascular;  Laterality: N/A;   Family History  Problem Relation Age of Onset  . Diabetes Father     Amputation  . Hypertension Father   . Hyperlipidemia Father   . Heart disease Father     Heart Disease before age 14  . Breast cancer Mother   . Cancer Mother     Breast    History  Substance Use Topics  . Smoking status: Current Every Day Smoker -- 0.50 packs/day for 51 years    Types: Cigarettes    Start date: 04/05/1961  . Smokeless tobacco: Never Used  . Alcohol Use: 0.0 oz/week    0 Standard drinks or equivalent per week     Comment: beer - 40 oz daily   OB History    No data available     Review of Systems  Constitutional: Negative for fever and chills.  HENT: Negative for rhinorrhea and sore throat.   Eyes: Negative for visual disturbance.  Respiratory: Positive for shortness of breath. Negative for cough.   Cardiovascular: Negative for chest pain and leg swelling.  Gastrointestinal: Negative for nausea, vomiting, abdominal  pain and diarrhea.  Genitourinary: Negative for dysuria.  Musculoskeletal: Negative for back pain and neck pain.  Skin: Negative for rash.  Neurological: Negative for dizziness, light-headedness and headaches.  Hematological: Does not bruise/bleed easily.  Psychiatric/Behavioral: Negative for confusion.      Allergies  Hctz  Home Medications   Prior to Admission medications   Medication Sig Start Date End Date Taking? Authorizing Provider  aspirin EC 81 MG tablet Take 1 tablet (81 mg total) by mouth daily. 01/29/13   [name], PA-C  bisoprolol (ZEBETA) 10 MG tablet Take 1 tablet (10 mg total) by mouth daily. 06/04/14   Josue Hector, MD  clopidogrel (PLAVIX)  75 MG tablet 1 TABLET BY MOUTH EVERY MORNING WITH BREAKFAST 06/05/14   Josue Hector, MD  dextromethorphan-guaiFENesin [facility] DM) 30-600 MG per 12 hr tablet Take 1 tablet by mouth 2 (two) times daily as needed for cough.    Historical Provider, MD  ipratropium-albuterol (DUONEB) 0.5-2.5 (3) MG/3ML SOLN Take 3 mLs by nebulization 3 (three) times daily.    Historical Provider, MD  isosorbide mononitrate (ISMO,MONOKET) 20 MG tablet Take 1/2 tab by mouth every morning 01/29/14   Historical Provider, MD  lovastatin (MEVACOR) 20 MG tablet Take 1 tablet (20 mg total) by mouth at bedtime. 05/20/14   [name], NP  nabumetone (RELAFEN) 750 MG tablet Take 750 mg by mouth daily. 01/22/14   Historical Provider, MD  NITROSTAT 0.4 MG SL tablet 1 TABLET UNDER TONGUE IF NEEDED EVERY 5 MINUTES FOR CHEST FOR PAIN FOR 3 DOSES IF NO RELIEF AFTER 3RD DOSE CALL 911. 03/11/14   Josue Hector, MD  oxybutynin (DITROPAN-XL) 5 MG 24 hr tablet Take 5 mg by mouth at bedtime.    Historical Provider, MD  TOVIAZ 8 MG TB24 tablet Take 8 mg by mouth daily. 04/30/14   Historical Provider, MD  traMADol (ULTRAM) 50 MG tablet Take 50 mg by mouth every morning.     Historical Provider, MD  valsartan (DIOVAN) 160 MG tablet Take 1 tablet (160 mg total) by mouth 2 (two) times daily. 2 daily 06/04/14   Josue Hector, MD   BP 134/66 mmHg  Pulse 67  Temp(Src) 98.3 F (36.8 C) (Oral)  Resp 15  Ht 5\' 3"  (1.6 m)  Wt 143 lb (64.864 kg)  BMI 25.34 kg/m2  SpO2 100% Physical Exam  Constitutional: She is oriented to person, place, and time. She appears well-developed and well-nourished. She appears distressed.  HENT:  Head: Normocephalic and atraumatic.  Right Ear: Hearing normal.  Left Ear: Hearing normal.  Nose: Nose normal.  Mouth/Throat: Oropharynx is clear and moist and mucous membranes are normal.  Eyes: Conjunctivae and EOM are normal. Pupils are equal, round, and reactive to light.  Neck: Normal range of motion. Neck  supple.  Cardiovascular: Regular rhythm, S1 normal and S2 normal.  Exam reveals no gallop and no friction rub.   No murmur heard. Pulmonary/Chest: She is in respiratory distress. She has decreased breath sounds. She has wheezes. She exhibits no tenderness.  Decreased breath sounds bilaterally. Diffuse expiratory wheezes.  Abdominal: Soft. Normal appearance and bowel sounds are normal. There is no hepatosplenomegaly. There is no tenderness. There is no rebound, no guarding, no tenderness at McBurney's point and negative Murphy's sign. No hernia.  Musculoskeletal: Normal range of motion.  Neurological: She is alert and oriented to person, place, and time. She has normal strength. No cranial nerve deficit or sensory deficit. Coordination normal. GCS  eye subscore is 4. GCS verbal subscore is 5. GCS motor subscore is 6.  Skin: Skin is warm, dry and intact. No rash noted. No cyanosis.  Psychiatric: She has a normal mood and affect. Her speech is normal and behavior is normal. Thought content normal.  Nursing note and vitals reviewed.   ED Course  Procedures (including critical care time)  DIAGNOSTIC STUDIES: Oxygen Saturation is 99% on room air, normal by my interpretation.    COORDINATION OF CARE: At 2300 Discussed treatment plan with patient. Patient agrees.   Labs Review Labs Reviewed  CBC WITH DIFFERENTIAL/PLATELET - Abnormal; Notable for the following:    Hemoglobin 10.0 (*)    HCT 32.4 (*)    MCH 24.2 (*)    RDW 19.7 (*)    Eosinophils Relative 7 (*)    All other components within normal limits  COMPREHENSIVE METABOLIC PANEL - Abnormal; Notable for the following:    Glucose, Bld 191 (*)    GFR calc non Af Amer 70 (*)    GFR calc Af Amer 81 (*)    All other components within normal limits  TROPONIN I - Abnormal; Notable for the following:    Troponin I 0.11 (*)    All other components within normal limits  BRAIN NATRIURETIC PEPTIDE - Abnormal; Notable for the following:    B  Natriuretic Peptide 1323.3 (*)    All other components within normal limits  CBC - Abnormal; Notable for the following:    RBC 3.61 (*)    Hemoglobin 8.7 (*)    HCT 28.4 (*)    MCH 24.1 (*)    RDW 19.6 (*)    All other components within normal limits  TROPONIN I - Abnormal; Notable for the following:    Troponin I 0.12 (*)    All other components within normal limits  COMPREHENSIVE METABOLIC PANEL - Abnormal; Notable for the following:    Glucose, Bld 135 (*)    GFR calc non Af Amer 86 (*)    All other components within normal limits  I-STAT ARTERIAL BLOOD GAS, ED - Abnormal; Notable for the following:    pH, Arterial 7.228 (*)    pCO2 arterial 64.5 (*)    pO2, Arterial 148.0 (*)    Bicarbonate 26.9 (*)    All other components within normal limits  I-STAT ARTERIAL BLOOD GAS, ED - Abnormal; Notable for the following:    pH, Arterial 7.319 (*)    pCO2 arterial 47.9 (*)    pO2, Arterial 260.0 (*)    Bicarbonate 24.6 (*)    All other components within normal limits  HEPARIN LEVEL (UNFRACTIONATED)  TROPONIN I  TROPONIN I  HEMOGLOBIN A1C    Imaging Review Dg Chest Port 1 View  07/02/2014   CLINICAL DATA:  Acute onset of shortness of breath. Initial encounter.  EXAM: PORTABLE CHEST - 1 VIEW  COMPARISON:  Chest radiograph performed 04/16/2014  FINDINGS: The lungs are well-aerated. Mild vascular congestion is noted. Minimal bilateral atelectasis is seen. There is no evidence of pleural effusion or pneumothorax.  The cardiomediastinal silhouette is borderline enlarged. No acute osseous abnormalities are seen.  IMPRESSION: Mild vascular congestion and borderline cardiomegaly. Minimal bilateral atelectasis seen.   Electronically Signed   By: Garald Balding M.D.   On: 07/02/2014 23:20     EKG Interpretation   Date/Time:  Wednesday July 03 2014 04:31:52 EDT Ventricular Rate:  76 PR Interval:  192 QRS Duration: 84 QT Interval:  458 QTC  Calculation: 515 R Axis:   78 Text  Interpretation:  Normal sinus rhythm Prolonged QT Abnormal ECG  Confirmed by Sukhman Kocher  MD, Livan Hires ([id number]) on 07/03/2014 5:39:40 AM      CRITICAL CARE Performed by: Brandy Pert, MD Total critical care time: 45 min Critical care time was exclusive of separately billable procedures and treating other patients. Critical care was necessary to treat or prevent imminent or life-threatening deterioration. Critical care was time spent personally by me on the following activities: development of treatment plan with patient and/or surrogate as well as nursing, discussions with consultants, evaluation of patient's response to treatment, examination of patient, obtaining history from patient or surrogate, ordering and performing treatments and interventions, ordering and review of laboratory studies, ordering and review of radiographic studies, pulse oximetry and re-evaluation of patient's condition.   MDM   Final diagnoses:  SOB (shortness of breath)  Elevated troponin  COPD exacerbation  Acute respiratory failure, unspecified whether with hypoxia or hypercapnia    11:06 PM 69 y.o. female w hx of HTN, CAD, COPD who pw sob and worsening cough over the last 2-3 days. Acute sob developing over the last few hours. Similar presentation in 2014, placed on bipap, had nstemi at that time. Denies cp on my exam. Likely copd exacerbation. EMS noted her oxygen saturation to be in the 80s on room air. The patient is afebrile, tachypneic, and mildly hypertensive on exam. She is able to talk in short phrases. She did get a breathing treatment in route. We'll give steroids, continuous breathing treatment, BiPAP and monitor closely for improvement.  The patient was found to have a mildly elevated troponin. Consulted cardiology and the hospitalist to admit to stepdown. The patient has improved significantly on BiPAP. We'll continue to monitor closely.   I personally performed the services described in this  documentation, which was scribed in my presence. The recorded information has been reviewed and is accurate.    Brandy Pert, MD 07/03/14 [phone]

## 2014-07-02 NOTE — ED Notes (Signed)
EMS/PT reports SOB/diff breathing that started yesterday and suddenly worsened 1.5 hours ago. Pt took 4 albuterol tx at home today with no relief. On ems arrival o2sats were 83% RA. Pt was placed on CPAP and breathing tx initiated by fire/EMS. Pt in severe distress on arrival to ED and switched to BIPAP. EDP at bedside on pt arrival.

## 2014-07-03 ENCOUNTER — Encounter (HOSPITAL_COMMUNITY): Payer: Self-pay | Admitting: General Practice

## 2014-07-03 DIAGNOSIS — R0602 Shortness of breath: Secondary | ICD-10-CM | POA: Diagnosis present

## 2014-07-03 DIAGNOSIS — Z716 Tobacco abuse counseling: Secondary | ICD-10-CM | POA: Diagnosis present

## 2014-07-03 DIAGNOSIS — R7989 Other specified abnormal findings of blood chemistry: Secondary | ICD-10-CM

## 2014-07-03 DIAGNOSIS — J441 Chronic obstructive pulmonary disease with (acute) exacerbation: Secondary | ICD-10-CM | POA: Diagnosis not present

## 2014-07-03 DIAGNOSIS — Z87442 Personal history of urinary calculi: Secondary | ICD-10-CM | POA: Diagnosis not present

## 2014-07-03 DIAGNOSIS — J9600 Acute respiratory failure, unspecified whether with hypoxia or hypercapnia: Secondary | ICD-10-CM | POA: Diagnosis not present

## 2014-07-03 DIAGNOSIS — F17210 Nicotine dependence, cigarettes, uncomplicated: Secondary | ICD-10-CM | POA: Diagnosis present

## 2014-07-03 DIAGNOSIS — Z888 Allergy status to other drugs, medicaments and biological substances status: Secondary | ICD-10-CM | POA: Diagnosis not present

## 2014-07-03 DIAGNOSIS — M1990 Unspecified osteoarthritis, unspecified site: Secondary | ICD-10-CM | POA: Diagnosis present

## 2014-07-03 DIAGNOSIS — D649 Anemia, unspecified: Secondary | ICD-10-CM | POA: Diagnosis present

## 2014-07-03 DIAGNOSIS — I10 Essential (primary) hypertension: Secondary | ICD-10-CM | POA: Diagnosis present

## 2014-07-03 DIAGNOSIS — R739 Hyperglycemia, unspecified: Secondary | ICD-10-CM

## 2014-07-03 DIAGNOSIS — I248 Other forms of acute ischemic heart disease: Secondary | ICD-10-CM | POA: Diagnosis not present

## 2014-07-03 DIAGNOSIS — I214 Non-ST elevation (NSTEMI) myocardial infarction: Secondary | ICD-10-CM | POA: Diagnosis present

## 2014-07-03 DIAGNOSIS — J9602 Acute respiratory failure with hypercapnia: Secondary | ICD-10-CM | POA: Diagnosis present

## 2014-07-03 DIAGNOSIS — Z9861 Coronary angioplasty status: Secondary | ICD-10-CM | POA: Diagnosis not present

## 2014-07-03 DIAGNOSIS — I2510 Atherosclerotic heart disease of native coronary artery without angina pectoris: Secondary | ICD-10-CM | POA: Diagnosis present

## 2014-07-03 DIAGNOSIS — Z7982 Long term (current) use of aspirin: Secondary | ICD-10-CM | POA: Diagnosis not present

## 2014-07-03 DIAGNOSIS — E785 Hyperlipidemia, unspecified: Secondary | ICD-10-CM | POA: Diagnosis present

## 2014-07-03 DIAGNOSIS — Z7902 Long term (current) use of antithrombotics/antiplatelets: Secondary | ICD-10-CM | POA: Diagnosis not present

## 2014-07-03 DIAGNOSIS — I252 Old myocardial infarction: Secondary | ICD-10-CM | POA: Diagnosis not present

## 2014-07-03 DIAGNOSIS — J9601 Acute respiratory failure with hypoxia: Secondary | ICD-10-CM | POA: Diagnosis present

## 2014-07-03 LAB — COMPREHENSIVE METABOLIC PANEL
ALK PHOS: 79 U/L (ref 39–117)
ALT: 17 U/L (ref 0–35)
ALT: 19 U/L (ref 0–35)
ANION GAP: 9 (ref 5–15)
AST: 33 U/L (ref 0–37)
AST: 25 U/L (ref 0–37)
Albumin: 4 g/dL (ref 3.5–5.2)
Albumin: 3.6 g/dL (ref 3.5–5.2)
Alkaline Phosphatase: 71 U/L (ref 39–117)
Anion gap: 12 (ref 5–15)
BUN: 16 mg/dL (ref 6–23)
BUN: 15 mg/dL (ref 6–23)
CALCIUM: 9.1 mg/dL (ref 8.4–10.5)
CO2: 24 mmol/L (ref 19–32)
CO2: 25 mmol/L (ref 19–32)
CREATININE: 0.72 mg/dL (ref 0.50–1.10)
Calcium: 8.7 mg/dL (ref 8.4–10.5)
Chloride: 102 mmol/L (ref 96–112)
Chloride: 105 mmol/L (ref 96–112)
Creatinine, Ser: 0.84 mg/dL (ref 0.50–1.10)
GFR calc Af Amer: 81 mL/min — ABNORMAL LOW (ref >90)
GFR calc Af Amer: >90 mL/min (ref >90)
GFR calc non Af Amer: 70 mL/min — ABNORMAL LOW (ref >90)
GFR calc non Af Amer: 86 mL/min — ABNORMAL LOW (ref >90)
Glucose, Bld: 191 mg/dL — ABNORMAL HIGH (ref 70–99)
Glucose, Bld: 135 mg/dL — ABNORMAL HIGH (ref 70–99)
POTASSIUM: 4.6 mmol/L (ref 3.5–5.1)
POTASSIUM: 4.1 mmol/L (ref 3.5–5.1)
Sodium: 138 mmol/L (ref 135–145)
Sodium: 139 mmol/L (ref 135–145)
TOTAL PROTEIN: 7.2 g/dL (ref 6.0–8.3)
Total Bilirubin: 0.5 mg/dL (ref 0.3–1.2)
Total Bilirubin: 0.5 mg/dL (ref 0.3–1.2)
Total Protein: 6.5 g/dL (ref 6.0–8.3)

## 2014-07-03 LAB — I-STAT ARTERIAL BLOOD GAS, ED
ACID-BASE DEFICIT: 2 mmol/L (ref 0.0–2.0)
Acid-base deficit: 2 mmol/L (ref 0.0–2.0)
BICARBONATE: 21.7 meq/L (ref 20.0–24.0)
Bicarbonate: 24.6 mEq/L — ABNORMAL HIGH (ref 20.0–24.0)
O2 Saturation: 100 %
O2 Saturation: 83 %
PO2 ART: 260 mmHg — AB (ref 80.0–100.0)
Patient temperature: 98.9
TCO2: 26 mmol/L (ref 0–100)
TCO2: 23 mmol/L (ref 0–100)
pCO2 arterial: 47.9 mmHg — ABNORMAL HIGH (ref 35.0–45.0)
pCO2 arterial: 33.1 mmHg — ABNORMAL LOW (ref 35.0–45.0)
pH, Arterial: 7.319 — ABNORMAL LOW (ref 7.350–7.450)
pH, Arterial: 7.426 (ref 7.350–7.450)
pO2, Arterial: 46 mmHg — ABNORMAL LOW (ref 80.0–100.0)

## 2014-07-03 LAB — CBC
HCT: 28.4 % — ABNORMAL LOW (ref 36.0–46.0)
HEMOGLOBIN: 8.7 g/dL — AB (ref 12.0–15.0)
MCH: 24.1 pg — AB (ref 26.0–34.0)
MCHC: 30.6 g/dL (ref 30.0–36.0)
MCV: 78.7 fL (ref 78.0–100.0)
Platelets: 296 10*3/uL (ref 150–400)
RBC: 3.61 MIL/uL — ABNORMAL LOW (ref 3.87–5.11)
RDW: 19.6 % — ABNORMAL HIGH (ref 11.5–15.5)
WBC: 6.8 10*3/uL (ref 4.0–10.5)

## 2014-07-03 LAB — MRSA PCR SCREENING: MRSA by PCR: NEGATIVE

## 2014-07-03 LAB — HEPARIN LEVEL (UNFRACTIONATED)
Heparin Unfractionated: 0.14 IU/mL — ABNORMAL LOW (ref 0.30–0.70)
Heparin Unfractionated: 0.21 IU/mL — ABNORMAL LOW (ref 0.30–0.70)

## 2014-07-03 LAB — TROPONIN I
TROPONIN I: 0.11 ng/mL — AB (ref <0.031)
TROPONIN I: 0.12 ng/mL — AB (ref <0.031)
Troponin I: 0.06 ng/mL — ABNORMAL HIGH (ref <0.031)
Troponin I: 0.05 ng/mL — ABNORMAL HIGH (ref <0.031)

## 2014-07-03 LAB — BRAIN NATRIURETIC PEPTIDE: B NATRIURETIC PEPTIDE 5: 1323.3 pg/mL — AB (ref 0.0–100.0)

## 2014-07-03 MED ORDER — TIOTROPIUM BROMIDE MONOHYDRATE 18 MCG IN CAPS: 18.0000 ug | ORAL_CAPSULE | Freq: Every day | RESPIRATORY_TRACT | Status: DC

## 2014-07-03 MED ORDER — IRBESARTAN 150 MG PO TABS
150.0000 mg | ORAL_TABLET | Freq: Every day | ORAL | Status: DC
  Administered 2014-07-03 – 2014-07-06 (×4): 150 mg via ORAL
  Filled 2014-07-03 (×6): qty 1

## 2014-07-03 MED ORDER — LEVOFLOXACIN IN D5W 500 MG/100ML IV SOLN
500.0000 mg | INTRAVENOUS | Status: DC
  Administered 2014-07-03 – 2014-07-04 (×2): 500 mg via INTRAVENOUS
  Filled 2014-07-03 (×2): qty 100

## 2014-07-03 MED ORDER — NITROGLYCERIN 0.4 MG SL SUBL
0.4000 mg | SUBLINGUAL_TABLET | SUBLINGUAL | Status: DC | PRN
Start: 2014-07-03 — End: 2014-07-06

## 2014-07-03 MED ORDER — METHYLPREDNISOLONE SODIUM SUCC 125 MG IJ SOLR
80.0000 mg | Freq: Three times a day (TID) | INTRAMUSCULAR | Status: DC
  Administered 2014-07-03 – 2014-07-04 (×4): 80 mg via INTRAVENOUS
  Filled 2014-07-03: qty 2
  Filled 2014-07-03 (×2): qty 1.28
  Filled 2014-07-03: qty 2
  Filled 2014-07-03: qty 1.28
  Filled 2014-07-03 (×2): qty 2

## 2014-07-03 MED ORDER — ALBUTEROL (5 MG/ML) CONTINUOUS INHALATION SOLN
10.0000 mg/h | INHALATION_SOLUTION | RESPIRATORY_TRACT | Status: DC
  Administered 2014-07-03: 10 mg/h via RESPIRATORY_TRACT

## 2014-07-03 MED ORDER — DM-GUAIFENESIN ER 30-600 MG PO TB12
1.0000 | ORAL_TABLET | Freq: Two times a day (BID) | ORAL | Status: DC | PRN
  Filled 2014-07-03 (×2): qty 1

## 2014-07-03 MED ORDER — ISOSORBIDE MONONITRATE 20 MG PO TABS
20.0000 mg | ORAL_TABLET | Freq: Every day | ORAL | Status: DC
  Administered 2014-07-03 – 2014-07-04 (×2): 20 mg via ORAL
  Filled 2014-07-03 (×4): qty 1

## 2014-07-03 MED ORDER — ACETAMINOPHEN 650 MG RE SUPP: 650.0000 mg | Freq: Four times a day (QID) | RECTAL | Status: DC | PRN

## 2014-07-03 MED ORDER — HEPARIN BOLUS VIA INFUSION
3500.0000 [IU] | Freq: Once | INTRAVENOUS | Status: AC
  Administered 2014-07-03: 3500 [IU] via INTRAVENOUS
  Filled 2014-07-03: qty 3500

## 2014-07-03 MED ORDER — ALBUTEROL SULFATE (2.5 MG/3ML) 0.083% IN NEBU
2.5000 mg | INHALATION_SOLUTION | Freq: Four times a day (QID) | RESPIRATORY_TRACT | Status: DC
  Administered 2014-07-03 (×2): 2.5 mg via RESPIRATORY_TRACT
  Filled 2014-07-03 (×3): qty 3

## 2014-07-03 MED ORDER — SODIUM CHLORIDE 0.9 % IV SOLN
INTRAVENOUS | Status: AC
  Administered 2014-07-03: 05:00:00 via INTRAVENOUS

## 2014-07-03 MED ORDER — ACETAMINOPHEN 325 MG PO TABS
650.0000 mg | ORAL_TABLET | Freq: Four times a day (QID) | ORAL | Status: DC | PRN
  Administered 2014-07-06: 650 mg via ORAL
  Filled 2014-07-03: qty 2

## 2014-07-03 MED ORDER — HEPARIN (PORCINE) IN NACL 100-0.45 UNIT/ML-% IJ SOLN
1000.0000 [IU]/h | INTRAMUSCULAR | Status: DC
  Administered 2014-07-03: 800 [IU]/h via INTRAVENOUS
  Filled 2014-07-03 (×2): qty 250

## 2014-07-03 MED ORDER — SODIUM CHLORIDE 0.9 % IV BOLUS (SEPSIS)
500.0000 mL | Freq: Once | INTRAVENOUS | Status: AC
  Administered 2014-07-03: 500 mL via INTRAVENOUS

## 2014-07-03 MED ORDER — PRAVASTATIN SODIUM 20 MG PO TABS
20.0000 mg | ORAL_TABLET | Freq: Every day | ORAL | Status: DC
  Administered 2014-07-03 – 2014-07-05 (×3): 20 mg via ORAL
  Filled 2014-07-03 (×5): qty 1

## 2014-07-03 MED ORDER — IPRATROPIUM-ALBUTEROL 0.5-2.5 (3) MG/3ML IN SOLN
3.0000 mL | RESPIRATORY_TRACT | Status: DC
Start: 2014-07-03 — End: 2014-07-04
  Administered 2014-07-03 – 2014-07-04 (×3): 3 mL via RESPIRATORY_TRACT
  Filled 2014-07-03 (×2): qty 3

## 2014-07-03 MED ORDER — FESOTERODINE FUMARATE ER 8 MG PO TB24
8.0000 mg | ORAL_TABLET | Freq: Every day | ORAL | Status: DC
  Administered 2014-07-03 – 2014-07-06 (×4): 8 mg via ORAL
  Filled 2014-07-03 (×6): qty 1

## 2014-07-03 MED ORDER — CLOPIDOGREL BISULFATE 75 MG PO TABS
75.0000 mg | ORAL_TABLET | Freq: Every day | ORAL | Status: DC
  Administered 2014-07-03 – 2014-07-06 (×4): 75 mg via ORAL
  Filled 2014-07-03 (×5): qty 1

## 2014-07-03 MED ORDER — HEPARIN (PORCINE) IN NACL 100-0.45 UNIT/ML-% IJ SOLN
1150.0000 [IU]/h | INTRAMUSCULAR | Status: DC
  Administered 2014-07-03: 1150 [IU]/h via INTRAVENOUS
  Filled 2014-07-03 (×2): qty 250

## 2014-07-03 MED ORDER — ASPIRIN 325 MG PO TABS
325.0000 mg | ORAL_TABLET | ORAL | Status: AC
  Administered 2014-07-03: 325 mg via ORAL
  Filled 2014-07-03: qty 1

## 2014-07-03 MED ORDER — ALBUTEROL SULFATE (2.5 MG/3ML) 0.083% IN NEBU: 2.5000 mg | INHALATION_SOLUTION | Freq: Four times a day (QID) | RESPIRATORY_TRACT | Status: DC | PRN

## 2014-07-03 MED ORDER — BISOPROLOL FUMARATE 10 MG PO TABS
10.0000 mg | ORAL_TABLET | Freq: Every day | ORAL | Status: DC
  Administered 2014-07-03 – 2014-07-05 (×3): 10 mg via ORAL
  Filled 2014-07-03 (×5): qty 1

## 2014-07-03 MED ORDER — SODIUM CHLORIDE 0.9 % IJ SOLN
3.0000 mL | Freq: Two times a day (BID) | INTRAMUSCULAR | Status: DC
Start: 2014-07-03 — End: 2014-07-06
  Administered 2014-07-03 – 2014-07-06 (×6): 3 mL via INTRAVENOUS

## 2014-07-03 MED ORDER — HEPARIN BOLUS VIA INFUSION
2000.0000 [IU] | Freq: Once | INTRAVENOUS | Status: AC
  Administered 2014-07-03: 2000 [IU] via INTRAVENOUS
  Filled 2014-07-03: qty 2000

## 2014-07-03 MED ORDER — TRAMADOL HCL 50 MG PO TABS
50.0000 mg | ORAL_TABLET | Freq: Every morning | ORAL | Status: DC
  Administered 2014-07-03 – 2014-07-06 (×4): 50 mg via ORAL
  Filled 2014-07-03 (×4): qty 1

## 2014-07-03 MED ORDER — ASPIRIN EC 81 MG PO TBEC
81.0000 mg | DELAYED_RELEASE_TABLET | Freq: Every day | ORAL | Status: DC
  Administered 2014-07-03 – 2014-07-06 (×4): 81 mg via ORAL
  Filled 2014-07-03 (×5): qty 1

## 2014-07-03 NOTE — Progress Notes (Signed)
The patient was seen by our cardiology team in consultation in the middle the night. There is a complete consult note. I checked on the patient and she is stable in the emergency room at this time awaiting a bed in the hospital. Cardiology will continue to follow the patient.  Daryel November, MD

## 2014-07-03 NOTE — Progress Notes (Signed)
[facility] PROGRESS NOTE  Brandy Copeland GMW:[id number] DOB: 1945/06/19 DOA: 07/02/2014 PCP: No PCP Per Patient  Assessment/Plan:                    COPD Exacerbation: Admitted to step down, started her on IV steroids, and duonebs, levaquin added on.    NSTEMI: Cardiology consulted and she ws put on heparin drip.  Cardiac enzymes and resume aspirin.   Anemia:  Normocytic. Repeat in am. Get anemia panel in am.    Code Status: full code Family Communication: none at bedside Disposition Plan: transfer to step down.    Consultants: cardiology Procedures:  none  Antibiotics:  levaquin  HPI/Subjective: Off bipap, is comfortable.   Objective: Filed Vitals:   07/03/14 1727  BP: 139/59  Pulse: 74  Temp: 98.3 F (36.8 C)  Resp: 14    Intake/Output Summary (Last 24 hours) at 07/03/14 1917 Last data filed at 07/03/14 1530  Gross per 24 hour  Intake    240 ml  Output      0 ml  Net    240 ml   Filed Weights   07/02/14 2302 07/03/14 1529  Weight: 64.864 kg (143 lb) 65 kg (143 lb 4.8 oz)    Exam:   General:  Alert afebrile comfortable  Cardiovascular: s1s2  Respiratory: bilateral scattered wheezing  Abdomen: soft non tender non distended bowel sounds heard  Musculoskeletal: no pedal edema.   Data Reviewed: Basic Metabolic Panel:  Recent Labs Lab 07/02/14 2300 07/03/14 0346  NA 138 139  K 4.6 4.1  CL 102 105  CO2 24 25  GLUCOSE 191* 135*  BUN 16 15  CREATININE 0.84 0.72  CALCIUM 9.1 8.7   Liver Function Tests:  Recent Labs Lab 07/02/14 2300 07/03/14 0346  AST 33 25  ALT 17 19  ALKPHOS 79 71  BILITOT 0.5 0.5  PROT 7.2 6.5  ALBUMIN 4.0 3.6   No results for input(s): LIPASE, AMYLASE in the last 168 hours. No results for input(s): AMMONIA in the last 168 hours. CBC:  Recent Labs Lab 07/02/14 2300 07/03/14 0346  WBC 9.3 6.8  NEUTROABS 5.0  --   HGB 10.0* 8.7*  HCT 32.4* 28.4*  MCV 78.3 78.7  PLT 334 296   Cardiac  Enzymes:  Recent Labs Lab 07/02/14 2300 07/03/14 0346 07/03/14 0940 07/03/14 1610  TROPONINI 0.11* 0.12* 0.06* 0.05*   BNP (last 3 results)  Recent Labs  07/02/14 2300  BNP 1323.3*    ProBNP (last 3 results) No results for input(s): PROBNP in the last 8760 hours.  CBG: No results for input(s): GLUCAP in the last 168 hours.  Recent Results (from the past 240 hour(s))  MRSA PCR Screening     Status: None   Collection Time: 07/03/14  3:42 PM  Result Value Ref Range Status   MRSA by PCR NEGATIVE NEGATIVE Final    Comment:        The GeneXpert MRSA Assay (FDA approved for NASAL specimens only), is one component of a comprehensive MRSA colonization surveillance program. It is not intended to diagnose MRSA infection nor to guide or monitor treatment for MRSA infections.      Studies: Dg Chest Port 1 View  07/02/2014   CLINICAL DATA:  Acute onset of shortness of breath. Initial encounter.  EXAM: PORTABLE CHEST - 1 VIEW  COMPARISON:  Chest radiograph performed 04/16/2014  FINDINGS: The lungs are well-aerated. Mild vascular congestion is noted. Minimal bilateral atelectasis is seen.  There is no evidence of pleural effusion or pneumothorax.  The cardiomediastinal silhouette is borderline enlarged. No acute osseous abnormalities are seen.  IMPRESSION: Mild vascular congestion and borderline cardiomegaly. Minimal bilateral atelectasis seen.   Electronically Signed   By: Garald Balding M.D.   On: 07/02/2014 23:20    Scheduled Meds: . albuterol  2.5 mg Nebulization Q6H  . aspirin EC  81 mg Oral Daily  . bisoprolol  10 mg Oral Daily  . clopidogrel  75 mg Oral Daily  . fesoterodine  8 mg Oral Daily  . irbesartan  150 mg Oral Daily  . isosorbide mononitrate  20 mg Oral Daily  . levofloxacin (LEVAQUIN) IV  500 mg Intravenous Q24H  . methylPREDNISolone (SOLU-MEDROL) injection  80 mg Intravenous 3 times per day  . pravastatin  20 mg Oral q1800  . sodium chloride  3 mL Intravenous  Q12H  . tiotropium  18 mcg Inhalation Daily  . traMADol  50 mg Oral q morning - 10a   Continuous Infusions: . heparin 1,000 Units/hr (07/03/14 1127)    Active Problems:   COPD exacerbation   NSTEMI (non-ST elevated myocardial infarction)   Anemia   Hyperglycemia    Time spent: 15 min    [facility] Pager [phone] If 7PM-7AM, please contact night-coverage at [email or web address], password [facility] 07/03/2014, 7:17 PM  LOS: 0 days

## 2014-07-03 NOTE — ED Notes (Signed)
Admitting MD repaged 

## 2014-07-03 NOTE — Progress Notes (Signed)
ANTICOAGULATION CONSULT NOTE - Initial Consult  Pharmacy Consult for Heparin  Indication: NSTEMI  Allergies  Allergen Reactions  . Hctz [Hydrochlorothiazide] Other (See Comments)    hyponatremia    Patient Measurements: Height: 5\' 3"  (160 cm) Weight: 143 lb (64.864 kg) IBW/kg (Calculated) : 52.4  Vital Signs: Temp: 98.3 F (36.8 C) (03/29 2302) Temp Source: Oral (03/29 2302) BP: 149/76 mmHg (03/30 0015) Pulse Rate: 70 (03/30 0015)  Labs:  Recent Labs  07/02/14 2300  HGB 10.0*  HCT 32.4*  PLT 334  CREATININE 0.84  TROPONINI 0.11*    Estimated Creatinine Clearance: 58.1 mL/min (by C-G formula based on Cr of 0.84).   Medical History: Past Medical History  Diagnosis Date  . Arthritis   . Hypertension   . Kidney stones   . Irregular heart beat   . Coronary artery disease     a. NSTEMI 2/2 demand ischemia 12/2012 in setting of COPD exacerbation => LHC (01/02/13): mLM 30, pLAD 40, mLAD 60, dLAD 70, small superior branch of the IM 70, oCFX 40, mCFX 70, dCFX 30, pRCA 60, mRCA 60-70, dRCA 50, oPDA 40, EF 55-65%. Medical therapy recommended.   [name] Hx of echocardiogram     a. Echo (12/31/12):  Severe LVH, EF 50-55%, normal wall motion, grade 2 diastolic dysfunction, trivial TR  . COPD (chronic obstructive pulmonary disease)   . Hyperlipidemia     Assessment: Admit complaint: shortness of breath -Mildly elevated troponin, to start heparin per pharmacy -Hgb 10, renal function good, other labs as above  Goal of Therapy:  Heparin level 0.3-0.7 units/ml Monitor platelets by anticoagulation protocol: Yes   Plan:  -Heparin 3500 units BOLUS -Start heparin drip at 800 units/hr -0900 HL -Daily CBC/HL -Monitor for bleeding   Narda Bonds 07/03/2014,12:41 AM

## 2014-07-03 NOTE — Progress Notes (Signed)
**Note De-Identified Lathaniel Legate Obfuscation** Patient removed from BIPAP and placed on 4 L [place], Patient tolerating well.  SAT 98%, BBS clear and diminished in bases.  RRT to continue to monitor.

## 2014-07-03 NOTE — ED Notes (Signed)
Pt remains on bipap states that she feels better. Pt able to speech in full sentences on bipap.

## 2014-07-03 NOTE — ED Notes (Signed)
Admitting MD paged for possible trial off bipap.

## 2014-07-03 NOTE — Progress Notes (Signed)
Assumed care from [facility].

## 2014-07-03 NOTE — H&P (Signed)
Brandy Copeland is an 69 y.o. female.    ? pcp  Chief Complaint: dyspnea HPI: 69 yo female with Copd, cad, apparently c/o dyspnea since yesterday with cough with yellow sputum.  Pt denies cp, palp, n/v, diarrhea, brbpr, black stool.    Past Medical History  Diagnosis Date  . Arthritis   . Hypertension   . Kidney stones   . Irregular heart beat   . Coronary artery disease     a. NSTEMI 2/2 demand ischemia 12/2012 in setting of COPD exacerbation => LHC (01/02/13): mLM 30, pLAD 40, mLAD 60, dLAD 70, small superior branch of the IM 70, oCFX 40, mCFX 70, dCFX 30, pRCA 60, mRCA 60-70, dRCA 50, oPDA 40, EF 55-65%. Medical therapy recommended.   [name] Hx of echocardiogram     a. Echo (12/31/12):  Severe LVH, EF 50-55%, normal wall motion, grade 2 diastolic dysfunction, trivial TR  . COPD (chronic obstructive pulmonary disease)   . Hyperlipidemia     Past Surgical History  Procedure Laterality Date  . Breast biopsy    . Left heart catheterization with coronary angiogram N/A 01/02/2013    Procedure: LEFT HEART CATHETERIZATION WITH CORONARY ANGIOGRAM;  Surgeon: Josue Hector, MD;  Location: [facility] CATH LAB;  Service: Cardiovascular;  Laterality: N/A;    Family History  Problem Relation Age of Onset  . Diabetes Father     Amputation  . Hypertension Father   . Hyperlipidemia Father   . Heart disease Father     Heart Disease before age 34  . Breast cancer Mother   . Cancer Mother     Breast    Social History:  reports that she has been smoking Cigarettes.  She started smoking about 53 years ago. She has a 25.5 pack-year smoking history. She has never used smokeless tobacco. She reports that she drinks alcohol. Her drug history is not on file.  Allergies:  Allergies  Allergen Reactions  . Hctz [Hydrochlorothiazide] Other (See Comments)    hyponatremia     (Not in a hospital admission)  Results for orders placed or performed during the hospital encounter of 07/02/14 (from the past 48  hour(s))  CBC with Differential/Platelet     Status: Abnormal   Collection Time: 07/02/14 11:00 PM  Result Value Ref Range   WBC 9.3 4.0 - 10.5 K/uL   RBC 4.14 3.87 - 5.11 MIL/uL   Hemoglobin 10.0 (L) 12.0 - 15.0 g/dL   HCT 32.4 (L) 36.0 - 46.0 %   MCV 78.3 78.0 - 100.0 fL   MCH 24.2 (L) 26.0 - 34.0 pg   MCHC 30.9 30.0 - 36.0 g/dL   RDW 19.7 (H) 11.5 - 15.5 %   Platelets 334 150 - 400 K/uL   Neutrophils Relative % 54 43 - 77 %   Neutro Abs 5.0 1.7 - 7.7 K/uL   Lymphocytes Relative 30 12 - 46 %   Lymphs Abs 2.8 0.7 - 4.0 K/uL   Monocytes Relative 8 3 - 12 %   Monocytes Absolute 0.8 0.1 - 1.0 K/uL   Eosinophils Relative 7 (H) 0 - 5 %   Eosinophils Absolute 0.6 0.0 - 0.7 K/uL   Basophils Relative 1 0 - 1 %   Basophils Absolute 0.1 0.0 - 0.1 K/uL  Comprehensive metabolic panel     Status: Abnormal   Collection Time: 07/02/14 11:00 PM  Result Value Ref Range   Sodium 138 135 - 145 mmol/L   Potassium 4.6 3.5 - 5.1 mmol/L  Chloride 102 96 - 112 mmol/L   CO2 24 19 - 32 mmol/L   Glucose, Bld 191 (H) 70 - 99 mg/dL   BUN 16 6 - 23 mg/dL   Creatinine, Ser 0.84 0.50 - 1.10 mg/dL   Calcium 9.1 8.4 - 10.5 mg/dL   Total Protein 7.2 6.0 - 8.3 g/dL   Albumin 4.0 3.5 - 5.2 g/dL   AST 33 0 - 37 U/L   ALT 17 0 - 35 U/L   Alkaline Phosphatase 79 39 - 117 U/L   Total Bilirubin 0.5 0.3 - 1.2 mg/dL   GFR calc non Af Amer 70 (L) >90 mL/min   GFR calc Af Amer 81 (L) >90 mL/min    Comment: (NOTE) The eGFR has been calculated using the CKD EPI equation. This calculation has not been validated in all clinical situations. eGFR's persistently <90 mL/min signify possible Chronic Kidney Disease.    Anion gap 12 5 - 15  Troponin I     Status: Abnormal   Collection Time: 07/02/14 11:00 PM  Result Value Ref Range   Troponin I 0.11 (H) <0.031 ng/mL    Comment:        PERSISTENTLY INCREASED TROPONIN VALUES IN THE RANGE OF 0.04-0.49 ng/mL CAN BE SEEN IN:       -UNSTABLE ANGINA       -CONGESTIVE  HEART FAILURE       -MYOCARDITIS       -CHEST TRAUMA       -ARRYHTHMIAS       -LATE PRESENTING MYOCARDIAL INFARCTION       -COPD   CLINICAL FOLLOW-UP RECOMMENDED.   Brain natriuretic peptide     Status: Abnormal   Collection Time: 07/02/14 11:00 PM  Result Value Ref Range   B Natriuretic Peptide 1323.3 (H) 0.0 - 100.0 pg/mL  I-Stat arterial blood gas, ED     Status: Abnormal   Collection Time: 07/02/14 11:08 PM  Result Value Ref Range   pH, Arterial 7.228 (L) 7.350 - 7.450   pCO2 arterial 64.5 (HH) 35.0 - 45.0 mmHg   pO2, Arterial 148.0 (H) 80.0 - 100.0 mmHg   Bicarbonate 26.9 (H) 20.0 - 24.0 mEq/L   TCO2 29 0 - 100 mmol/L   O2 Saturation 99.0 %   Acid-base deficit 2.0 0.0 - 2.0 mmol/L   Patient temperature 98.6 F    Collection site RADIAL, ALLEN'S TEST ACCEPTABLE    Drawn by RT    Sample type ARTERIAL    Comment NOTIFIED PHYSICIAN    Dg Chest Port 1 View  07/02/2014   CLINICAL DATA:  Acute onset of shortness of breath. Initial encounter.  EXAM: PORTABLE CHEST - 1 VIEW  COMPARISON:  Chest radiograph performed 04/16/2014  FINDINGS: The lungs are well-aerated. Mild vascular congestion is noted. Minimal bilateral atelectasis is seen. There is no evidence of pleural effusion or pneumothorax.  The cardiomediastinal silhouette is borderline enlarged. No acute osseous abnormalities are seen.  IMPRESSION: Mild vascular congestion and borderline cardiomegaly. Minimal bilateral atelectasis seen.   Electronically Signed   By: Garald Balding M.D.   On: 07/02/2014 23:20    Review of Systems  Constitutional: Negative.   HENT: Negative.   Eyes: Negative.   Respiratory: Positive for cough, sputum production, shortness of breath and wheezing. Negative for hemoptysis.   Cardiovascular: Negative.   Gastrointestinal: Negative.   Genitourinary: Negative.   Musculoskeletal: Negative.   Skin: Negative.   Neurological: Negative.   Endo/Heme/Allergies: Negative.   Psychiatric/Behavioral: Negative.  Blood pressure 149/76, pulse 71, temperature 98.3 F (36.8 C), temperature source Oral, resp. rate 18, height 5\' 3"  (1.6 m), weight 64.864 kg (143 lb), SpO2 100 %. Physical Exam  Constitutional: She is oriented to person, place, and time. She appears well-developed and well-nourished.  HENT:  Head: Normocephalic and atraumatic.  Mouth/Throat: No oropharyngeal exudate.  Eyes: Conjunctivae and EOM are normal. Pupils are equal, round, and reactive to light. No scleral icterus.  Neck: Normal range of motion. Neck supple. No JVD present. No tracheal deviation present. No thyromegaly present.  Cardiovascular: Normal rate and regular rhythm.  Exam reveals no gallop and no friction rub.   No murmur heard. Respiratory: No respiratory distress. She has wheezes. She has no rales. She exhibits no tenderness.  GI: Soft. Bowel sounds are normal. She exhibits no distension. There is no tenderness. There is no rebound and no guarding.  Musculoskeletal: Normal range of motion. She exhibits no edema or tenderness.  Lymphadenopathy:    She has no cervical adenopathy.  Neurological: She is alert and oriented to person, place, and time. She has normal reflexes. She displays normal reflexes. No cranial nerve deficit. She exhibits normal muscle tone. Coordination normal.  Skin: Skin is warm and dry. No rash noted. No erythema. No pallor.  Psychiatric: She has a normal mood and affect. Her behavior is normal. Judgment and thought content normal.     Assessment/Plan Copd exacerbation Solumedrol 80mg  iv q8h spiriva 1 puff qday Albuterol neb 1 neb po q6h  And q6h prn Bipap levaquin 500mg  iv qday  Hyperglycemia Check hga1c  Nstemi ED will consult cardiology , appreciate input  Anemia Check cbc in am  DVT prophylaxis: scd,   Jani Gravel 07/03/2014, 1:21 AM

## 2014-07-03 NOTE — Consult Note (Signed)
Cardiology Consultation Note  Patient ID: Brandy Copeland, MRN: [id number], DOB/AGE: [birth year] 69 y.o. Admit date: 07/02/2014   Date of Consult: 07/03/2014 Primary Physician: No PCP Per Patient Primary Cardiologist: Dr. Johnsie Cancel  Chief Complaint: Shortness of breath Reason for Consultation: Troponin elevation  HPI: 69 year old woman with history of severe COPD, coronary artery disease being treated medically, diastolic dysfunction, mild aortic stenosis, hypertension, and hyperlipidemia, who we've been asked to see due to a mild troponin elevation in the setting of a COPD exacerbation.  The patient states she has had several days of increasing shortness of breath, initially with exertion but also at rest.  She notes accompanying wheezing.  His prompted her to come to the emergency department today, where she was found to be in significant respiratory distress requiring BiPAP.  She notes with BiPAP and multiple nebulizer treatments, her breathing has improved.  She's not had any chest pain and states that her weight has been relatively stable, having gained maybe only a few ounces recently.  She denies significant lower extremity swelling.  She has stable 2-3 pillow orthopnea.  She reports being compliant with her medications, though she cannot recall what they are.  She has not had any significant bleeding.  The patient was last seen by Dr. Johnsie Cancel on 06/04/14, which time she was doing well.  It should be noted that the patient had a similar episode in 12/2012, albeit with chest pain.  At that time her troponin trended up to 1.1 with subsequent coronary angiography demonstrating significant disease involving the LCx and RCA territories.  Given her comorbidities and technical difficulty with percutaneous intervention, the decision was made to treat her medically, which he has continued since that time.  Past Medical History  Diagnosis Date  . Arthritis   . Hypertension   . Kidney stones   . Irregular  heart beat   . Coronary artery disease     a. NSTEMI 2/2 demand ischemia 12/2012 in setting of COPD exacerbation => LHC (01/02/13): mLM 30, pLAD 40, mLAD 60, dLAD 70, small superior branch of the IM 70, oCFX 40, mCFX 70, dCFX 30, pRCA 60, mRCA 60-70, dRCA 50, oPDA 40, EF 55-65%. Medical therapy recommended.   [name] Hx of echocardiogram     a. Echo (12/31/12):  Severe LVH, EF 50-55%, normal wall motion, grade 2 diastolic dysfunction, trivial TR  . COPD (chronic obstructive pulmonary disease)   . Hyperlipidemia       Most Recent Cardiac Studies: Echo (06/11/14): moderate LVH with normal systolic function.  Grade 2 diastolic dysfunction with elevated filling pressures.  Aortic valve thickening with mild stenosis (mean gradient10 mmHg).  Normal RV size and function.  Mild left atrial dilation noted.  Mildly increased pulmonary artery pressures.  LHC (01/02/13): LMCA 30% stenosis, LAD 40% proximal, 60% mid, and 70% distal. Large IM bifurcating vessel with 70% tortuous disease and a small superior branch.  LCx with 40% ostial and 70% mid disease.  RCA with 60% proximal, 6070% mid, and 50% distal.  Decision was made to treat medically.   Surgical History:  Past Surgical History  Procedure Laterality Date  . Breast biopsy    . Left heart catheterization with coronary angiogram N/A 01/02/2013    Procedure: LEFT HEART CATHETERIZATION WITH CORONARY ANGIOGRAM;  Surgeon: Josue Hector, MD;  Location[facility] CATH LAB;  Service: Cardiovascular;  Laterality: N/A;     Home Meds: Prior to Admission medications   Medication Sig Start Date Kajsa Butrum Date Taking? Authorizing Provider  aspirin EC 81 MG tablet Take 1 tablet (81 mg total) by mouth daily. 01/29/13   [name], PA-C  bisoprolol (ZEBETA) 10 MG tablet Take 1 tablet (10 mg total) by mouth daily. 06/04/14   Josue Hector, MD  clopidogrel (PLAVIX) 75 MG tablet 1 TABLET BY MOUTH EVERY MORNING WITH BREAKFAST 06/05/14   Josue Hector, MD  dextromethorphan-guaiFENesin  [facility] DM) 30-600 MG per 12 hr tablet Take 1 tablet by mouth 2 (two) times daily as needed for cough.    Historical Provider, MD  ipratropium-albuterol (DUONEB) 0.5-2.5 (3) MG/3ML SOLN Take 3 mLs by nebulization 3 (three) times daily.    Historical Provider, MD  isosorbide mononitrate (ISMO,MONOKET) 20 MG tablet Take 1/2 tab by mouth every morning 01/29/14   Historical Provider, MD  lovastatin (MEVACOR) 20 MG tablet Take 1 tablet (20 mg total) by mouth at bedtime. 05/20/14   [name], NP  nabumetone (RELAFEN) 750 MG tablet Take 750 mg by mouth daily. 01/22/14   Historical Provider, MD  NITROSTAT 0.4 MG SL tablet 1 TABLET UNDER TONGUE IF NEEDED EVERY 5 MINUTES FOR CHEST FOR PAIN FOR 3 DOSES IF NO RELIEF AFTER 3RD DOSE CALL 911. 03/11/14   Josue Hector, MD  oxybutynin (DITROPAN-XL) 5 MG 24 hr tablet Take 5 mg by mouth at bedtime.    Historical Provider, MD  TOVIAZ 8 MG TB24 tablet Take 8 mg by mouth daily. 04/30/14   Historical Provider, MD  traMADol (ULTRAM) 50 MG tablet Take 50 mg by mouth every morning.     Historical Provider, MD  valsartan (DIOVAN) 160 MG tablet Take 1 tablet (160 mg total) by mouth 2 (two) times daily. 2 daily 06/04/14   Josue Hector, MD    Inpatient Medications:  . albuterol  2.5 mg Nebulization Q6H  . aspirin EC  81 mg Oral Daily  . bisoprolol  10 mg Oral Daily  . clopidogrel  75 mg Oral Daily  . fesoterodine  8 mg Oral Daily  . irbesartan  150 mg Oral Daily  . isosorbide mononitrate  20 mg Oral Daily  . methylPREDNISolone (SOLU-MEDROL) injection  80 mg Intravenous 3 times per day  . pravastatin  20 mg Oral q1800  . sodium chloride  3 mL Intravenous Q12H  . tiotropium  18 mcg Inhalation Daily  . traMADol  50 mg Oral q morning - 10a   . sodium chloride    . albuterol 10 mg/hr (07/03/14 0129)  . heparin 800 Units/hr (07/03/14 0055)  . levofloxacin (LEVAQUIN) IV      Allergies:  Allergies  Allergen Reactions  . Hctz [Hydrochlorothiazide] Other (See  Comments)    hyponatremia    History   Social History  . Marital Status: Single    Spouse Name: N/A  . Number of Children: 0  . Years of Education: N/A   Occupational History  . unemployed    Social History Main Topics  . Smoking status: Current Every Day Smoker -- 0.50 packs/day for 51 years    Types: Cigarettes    Start date: 04/05/1961  . Smokeless tobacco: Never Used  . Alcohol Use: 0.0 oz/week    0 Standard drinks or equivalent per week     Comment: beer - 40 oz daily  . Drug Use: Not on file  . Sexual Activity: Not on file   Other Topics Concern  . Not on file   Social History Narrative   2 cups of coffee daily  Family History  Problem Relation Age of Onset  . Diabetes Father     Amputation  . Hypertension Father   . Hyperlipidemia Father   . Heart disease Father     Heart Disease before age 73  . Breast cancer Mother   . Cancer Mother     Breast      Review of Systems: A 12 system review of systems was performed and was negative, except as noted in the history of present illness.  Labs:  Recent Labs  07/02/14 2300  TROPONINI 0.11*   Lab Results  Component Value Date   WBC 9.3 07/02/2014   HGB 10.0* 07/02/2014   HCT 32.4* 07/02/2014   MCV 78.3 07/02/2014   PLT 334 07/02/2014    Recent Labs Lab 07/02/14 2300  NA 138  K 4.6  CL 102  CO2 24  BUN 16  CREATININE 0.84  CALCIUM 9.1  PROT 7.2  BILITOT 0.5  ALKPHOS 79  ALT 17  AST 33  GLUCOSE 191*   Lab Results  Component Value Date   CHOL 158 03/22/2013   HDL 64.80 03/22/2013   LDLCALC 84 03/22/2013   TRIG 47.0 03/22/2013    Radiology/Studies:  Dg Chest Port 1 View  07/02/2014   CLINICAL DATA:  Acute onset of shortness of breath. Initial encounter.  EXAM: PORTABLE CHEST - 1 VIEW  COMPARISON:  Chest radiograph performed 04/16/2014  FINDINGS: The lungs are well-aerated. Mild vascular congestion is noted. Minimal bilateral atelectasis is seen. There is no evidence of  pleural effusion or pneumothorax.  The cardiomediastinal silhouette is borderline enlarged. No acute osseous abnormalities are seen.  IMPRESSION: Mild vascular congestion and borderline cardiomegaly. Minimal bilateral atelectasis seen.   Electronically Signed   By: Garald Balding M.D.   On: 07/02/2014 23:20    Wt Readings from Last 3 Encounters:  07/02/14 64.864 kg (143 lb)  06/04/14 65.318 kg (144 lb)  04/16/14 63.957 kg (141 lb)    EKG: Some baseline wander noted.sinus rhythm with poor R-wave progression and isolated Q-wave and T-wave inversion in aVL.  The aVL findings are new compared to the tracing of 06/04/14.  Physical Exam: Blood pressure 112/53, pulse 69, temperature 98.3 F (36.8 C), temperature source Oral, resp. rate 16, height 5\' 3"  (1.6 m), weight 64.864 kg (143 lb), SpO2 99 %. General: well-developed woman lying in bed comfortably wearing BiPAP. Head: Normocephalic, atraumatic, sclera non-icteric, no xanthomas, nares are without discharge.  Neck: Negative for carotid bruits. Unable to assess JVP secondary to support straps for BiPAP. Lungs: Poor airway movement with diffuse expiratory wheezes.  No crackles appreciated. Heart: RRR with S1 S2. No murmurs, rubs, or gallops appreciated. Abdomen: Soft, non-tender, non-distended with normoactive bowel sounds. No hepatomegaly. No rebound/guarding. No obvious abdominal masses. Msk:  Strength and tone appear normal for age. Extremities: No clubbing or cyanosis. No edema.  Distal pedal pulses are 2+ and equal bilaterally. Neuro: Alert and oriented X 3. No focal deficit. Moves all extremities spontaneously. Psych:  Responds to questions appropriately with a normal affect.    Assessment and Plan:  69 year old woman with multivessel CAD being treated medically, diastolic dysfunction, mild AS, and severe COPD, whom we have been asked to evaluate due to a mild troponin elevation in the setting of a COPD exacerbation.  Troponin elevation:  This is very mild and most likely reflect supply-demand mismatch in the setting of the patient's COPD exacerbation with accompanying hypoxia and known multivessel coronary artery disease.  Her lack  of chest pain is reassuring at this time.  Her EKG shows some changes isolated to lead aVL, which are nonspecific.  There is no indication for urgent intervention, particularly given the fact that the patient has improved with treatment of her COPD and known complex coronary anatomy. - Repeat EKG to evaluate if aVL lateral limb lead findings are real or artifact, given baseline wander. - Trend troponins Q6 hours until they have peaked - Continue aspirin, clopidogrel, beta blocker (bisoprolol if possible), and isosorbide mononitrate - Continue lovastatin, though patient may benefit from a higher potency agent, if tolerated, in the future  COPD exacerbation:  Improved with nebs, steroids, and bipap - Defer to hospitalist service  Hypertension:  BP labile, likely due to acute illness. - Continue home medication regimen.   Signed, Joye Wesenberg A. MD 07/03/2014, 4:02 AM

## 2014-07-03 NOTE — ED Notes (Signed)
Attempted Report 

## 2014-07-03 NOTE — Progress Notes (Signed)
ANTICOAGULATION CONSULT NOTE - FOLLOW UP    HL = 0.21 (goal 0.3 - 0.7 units/mL) Heparin dosing weight = 65 kg   Assessment: 69 YOF continues on IV heparin for NSTEMI.  Heparin level rising but remain sub-therapeutic.  No issue with infusion nor bleeding per RN.   Plan: - Increase heparin gtt to 1150 units/hr - Check 6 hr HL    Alliah Boulanger D. Mina Marble, PharmD, BCPS Pager:  [phone] 07/03/2014, 9:08 PM

## 2014-07-03 NOTE — ED Notes (Signed)
Pt tolerating cannula well. spO2 100%

## 2014-07-03 NOTE — Progress Notes (Signed)
ANTICOAGULATION CONSULT NOTE - Follow Up Consult  Pharmacy Consult for heparin Indication: NSTEMI  Allergies  Allergen Reactions  . Hctz [Hydrochlorothiazide] Other (See Comments)    hyponatremia    Patient Measurements: Height: 5\' 3"  (160 cm) Weight: 143 lb (64.864 kg) IBW/kg (Calculated) : 52.4   Vital Signs: Temp: 98.9 F (37.2 C) (03/30 1113) Temp Source: Oral (03/30 1113) BP: 148/67 mmHg (03/30 1113) Pulse Rate: 76 (03/30 1113)  Labs:  Recent Labs  07/02/14 2300 07/03/14 0346 07/03/14 0940 07/03/14 0943  HGB 10.0* 8.7*  --   --   HCT 32.4* 28.4*  --   --   PLT 334 296  --   --   HEPARINUNFRC  --   --   --  0.14*  CREATININE 0.84 0.72  --   --   TROPONINI 0.11* 0.12* 0.06*  --     Estimated Creatinine Clearance: 61 mL/min (by C-G formula based on Cr of 0.72).   Assessment: 69 yo F on heparin drip per pharmacy for troponin elevation.  HL is low at 0.14 after a 3500 unit heparin bolus and heparin drip at 800 units/hr.   Hg 10>8.7; Hct 32.4>28.4, probably just istat lab vs actual lab value.  pltc WNL.  Pt still in ED holding for stepdown bed.  RN checked IV site and heparin infusing well.   No bleeding reported.    Goal of Therapy: heaprin  Heparin level 0.3-0.7 units/ml Monitor platelets by anticoagulation protocol: Yes   Plan: -heparin bolus 2000 units x1 -increase heparin drip to 1000 units/hr and recheck HL  -daily HL and CBC while on heparin drip  Eudelia Bunch, Pharm.D. [phone] 07/03/2014 11:23 AM

## 2014-07-04 DIAGNOSIS — J9600 Acute respiratory failure, unspecified whether with hypoxia or hypercapnia: Secondary | ICD-10-CM | POA: Insufficient documentation

## 2014-07-04 DIAGNOSIS — I248 Other forms of acute ischemic heart disease: Secondary | ICD-10-CM

## 2014-07-04 DIAGNOSIS — R7989 Other specified abnormal findings of blood chemistry: Secondary | ICD-10-CM

## 2014-07-04 DIAGNOSIS — R778 Other specified abnormalities of plasma proteins: Secondary | ICD-10-CM | POA: Insufficient documentation

## 2014-07-04 DIAGNOSIS — J441 Chronic obstructive pulmonary disease with (acute) exacerbation: Principal | ICD-10-CM

## 2014-07-04 LAB — CBC
HEMATOCRIT: 27.3 % — AB (ref 36.0–46.0)
Hemoglobin: 8.2 g/dL — ABNORMAL LOW (ref 12.0–15.0)
MCH: 23.7 pg — ABNORMAL LOW (ref 26.0–34.0)
MCHC: 30 g/dL (ref 30.0–36.0)
MCV: 78.9 fL (ref 78.0–100.0)
Platelets: 295 10*3/uL (ref 150–400)
RBC: 3.46 MIL/uL — ABNORMAL LOW (ref 3.87–5.11)
RDW: 19.8 % — AB (ref 11.5–15.5)
WBC: 7.2 10*3/uL (ref 4.0–10.5)

## 2014-07-04 LAB — HEMOGLOBIN A1C
Hgb A1c MFr Bld: 5.5 % (ref 4.8–5.6)
MEAN PLASMA GLUCOSE: 111 mg/dL

## 2014-07-04 LAB — HEPARIN LEVEL (UNFRACTIONATED)
Heparin Unfractionated: 0.32 IU/mL (ref 0.30–0.70)
Heparin Unfractionated: 0.42 IU/mL (ref 0.30–0.70)

## 2014-07-04 LAB — RETICULOCYTES
RBC.: 3.46 MIL/uL — AB (ref 3.87–5.11)
RETIC CT PCT: 1.4 % (ref 0.4–3.1)
Retic Count, Absolute: 48.4 10*3/uL (ref 19.0–186.0)

## 2014-07-04 MED ORDER — IPRATROPIUM-ALBUTEROL 0.5-2.5 (3) MG/3ML IN SOLN
3.0000 mL | Freq: Four times a day (QID) | RESPIRATORY_TRACT | Status: DC
  Administered 2014-07-04 – 2014-07-05 (×4): 3 mL via RESPIRATORY_TRACT
  Filled 2014-07-04 (×3): qty 3

## 2014-07-04 MED ORDER — HEPARIN SODIUM (PORCINE) 5000 UNIT/ML IJ SOLN
5000.0000 [IU] | Freq: Three times a day (TID) | INTRAMUSCULAR | Status: DC
  Administered 2014-07-04 – 2014-07-06 (×5): 5000 [IU] via SUBCUTANEOUS
  Filled 2014-07-04 (×6): qty 1

## 2014-07-04 MED ORDER — CETYLPYRIDINIUM CHLORIDE 0.05 % MT LIQD
7.0000 mL | Freq: Two times a day (BID) | OROMUCOSAL | Status: DC
  Administered 2014-07-04 – 2014-07-06 (×5): 7 mL via OROMUCOSAL

## 2014-07-04 MED ORDER — ISOSORBIDE MONONITRATE 20 MG PO TABS
20.0000 mg | ORAL_TABLET | Freq: Two times a day (BID) | ORAL | Status: DC
  Administered 2014-07-04 – 2014-07-06 (×4): 20 mg via ORAL
  Filled 2014-07-04 (×5): qty 1

## 2014-07-04 MED ORDER — METHYLPREDNISOLONE SODIUM SUCC 125 MG IJ SOLR
60.0000 mg | INTRAMUSCULAR | Status: DC
  Administered 2014-07-04: 60 mg via INTRAVENOUS
  Filled 2014-07-04: qty 0.96

## 2014-07-04 MED ORDER — LEVOFLOXACIN 500 MG PO TABS
500.0000 mg | ORAL_TABLET | Freq: Every day | ORAL | Status: DC
Start: 2014-07-05 — End: 2014-07-06
  Administered 2014-07-05 – 2014-07-06 (×2): 500 mg via ORAL
  Filled 2014-07-04 (×2): qty 1

## 2014-07-04 MED ORDER — HYDRALAZINE HCL 20 MG/ML IJ SOLN: 5.0000 mg | Freq: Four times a day (QID) | INTRAMUSCULAR | Status: DC | PRN

## 2014-07-04 NOTE — Progress Notes (Signed)
ANTICOAGULATION CONSULT NOTE - Follow Up Consult  Pharmacy Consult for heparin Indication: NSTEMI   Labs:  Recent Labs  07/02/14 2300 07/03/14 0346 07/03/14 0940 07/03/14 0943 07/03/14 1610 07/03/14 1957 07/04/14 0342  HGB 10.0* 8.7*  --   --   --   --  8.2*  HCT 32.4* 28.4*  --   --   --   --  27.3*  PLT 334 296  --   --   --   --  295  HEPARINUNFRC  --   --   --  0.14*  --  0.21* 0.32  CREATININE 0.84 0.72  --   --   --   --   --   TROPONINI 0.11* 0.12* 0.06*  --  0.05*  --   --      Assessment/Plan:  69yo female therapeutic on heparin after rate inceases. Will continue gtt at current rate and confirm stable with additional level.   Wynona Neat, PharmD, BCPS  07/04/2014,4:35 AM

## 2014-07-04 NOTE — Progress Notes (Addendum)
[facility] PROGRESS NOTE  Brandy Copeland HCW:[id number] DOB: May 19, 1945 DOA: 07/02/2014 PCP: No PCP Per Patient Interim summary: 69 year old lady admitted for copd exacerbation. She denies any chest pain. Her lab work revealed elevated troponins, cardiology consulted and recommended no further work up needed at this time.   Assessment/Plan:                    Acute respiratory failure from hypoxia and hypercapnia from acute COPD Exacerbation: Admitted to step down, started her on IV steroids, and duonebs, levaquin added on. Her breathing this am is much improved and we have decreased the dose of steroids to 60 mg daily. Plan to transition to oral steroids in the next 24 hours. Her coughing has improved. .she is still requiring 2 lit of [place] oxygen.    NSTEMI: Cardiology consulted and she ws put on heparin drip. She denies any chest pain and her troponins are in downward trend. As per cardiology she does not need any further work up.   Anemia:  Normocytic. Repeat in am is stable around 8 . Anemia panel ordered and is pending.   accelerted hypertension: Resume home meds and add IV hydralazine.    Code Status: full code Family Communication: friend at bedside Disposition Plan: transfer to telemetry.    Consultants: cardiology Procedures:  none  Antibiotics:  levaquin  HPI/Subjective: Off bipap, is comfortable.   Objective: Filed Vitals:   07/04/14 1635  BP: 160/72  Pulse: 78  Temp: 98.6 F (37 C)  Resp: 16    Intake/Output Summary (Last 24 hours) at 07/04/14 1643 Last data filed at 07/04/14 1200  Gross per 24 hour  Intake 421.38 ml  Output      0 ml  Net 421.38 ml   Filed Weights   07/02/14 2302 07/03/14 1529 07/04/14 0500  Weight: 64.864 kg (143 lb) 65 kg (143 lb 4.8 oz) 64.8 kg (142 lb 13.7 oz)    Exam:   General:  Alert afebrile comfortable  Cardiovascular: s1s2  Respiratory: bilateral scattered wheezing, no rhonchi.   Abdomen: soft non  tender non distended bowel sounds heard  Musculoskeletal: no pedal edema.   Data Reviewed: Basic Metabolic Panel:  Recent Labs Lab 07/02/14 2300 07/03/14 0346  NA 138 139  K 4.6 4.1  CL 102 105  CO2 24 25  GLUCOSE 191* 135*  BUN 16 15  CREATININE 0.84 0.72  CALCIUM 9.1 8.7   Liver Function Tests:  Recent Labs Lab 07/02/14 2300 07/03/14 0346  AST 33 25  ALT 17 19  ALKPHOS 79 71  BILITOT 0.5 0.5  PROT 7.2 6.5  ALBUMIN 4.0 3.6   No results for input(s): LIPASE, AMYLASE in the last 168 hours. No results for input(s): AMMONIA in the last 168 hours. CBC:  Recent Labs Lab 07/02/14 2300 07/03/14 0346 07/04/14 0342  WBC 9.3 6.8 7.2  NEUTROABS 5.0  --   --   HGB 10.0* 8.7* 8.2*  HCT 32.4* 28.4* 27.3*  MCV 78.3 78.7 78.9  PLT 334 296 295   Cardiac Enzymes:  Recent Labs Lab 07/02/14 2300 07/03/14 0346 07/03/14 0940 07/03/14 1610  TROPONINI 0.11* 0.12* 0.06* 0.05*   BNP (last 3 results)  Recent Labs  07/02/14 2300  BNP 1323.3*    ProBNP (last 3 results) No results for input(s): PROBNP in the last 8760 hours.  CBG: No results for input(s): GLUCAP in the last 168 hours.  Recent Results (from the past 240 hour(s))  MRSA  PCR Screening     Status: None   Collection Time: 07/03/14  3:42 PM  Result Value Ref Range Status   MRSA by PCR NEGATIVE NEGATIVE Final    Comment:        The GeneXpert MRSA Assay (FDA approved for NASAL specimens only), is one component of a comprehensive MRSA colonization surveillance program. It is not intended to diagnose MRSA infection nor to guide or monitor treatment for MRSA infections.      Studies: Dg Chest Port 1 View  07/02/2014   CLINICAL DATA:  Acute onset of shortness of breath. Initial encounter.  EXAM: PORTABLE CHEST - 1 VIEW  COMPARISON:  Chest radiograph performed 04/16/2014  FINDINGS: The lungs are well-aerated. Mild vascular congestion is noted. Minimal bilateral atelectasis is seen. There is no  evidence of pleural effusion or pneumothorax.  The cardiomediastinal silhouette is borderline enlarged. No acute osseous abnormalities are seen.  IMPRESSION: Mild vascular congestion and borderline cardiomegaly. Minimal bilateral atelectasis seen.   Electronically Signed   By: Garald Balding M.D.   On: 07/02/2014 23:20    Scheduled Meds: . antiseptic oral rinse  7 mL Mouth Rinse BID  . aspirin EC  81 mg Oral Daily  . bisoprolol  10 mg Oral Daily  . clopidogrel  75 mg Oral Daily  . fesoterodine  8 mg Oral Daily  . heparin subcutaneous  5,000 Units Subcutaneous 3 times per day  . ipratropium-albuterol  3 mL Nebulization QID  . irbesartan  150 mg Oral Daily  . isosorbide mononitrate  20 mg Oral Daily  . [START ON 07/05/2014] levofloxacin  500 mg Oral Daily  . methylPREDNISolone (SOLU-MEDROL) injection  60 mg Intravenous Q24H  . pravastatin  20 mg Oral q1800  . sodium chloride  3 mL Intravenous Q12H  . traMADol  50 mg Oral q morning - 10a   Continuous Infusions:    Active Problems:   COPD exacerbation   Anemia   Hyperglycemia    Time spent: 25 min    [facility] Pager [phone] If 7PM-7AM, please contact night-coverage at [email or web address], password [facility] 07/04/2014, 4:43 PM  LOS: 1 day

## 2014-07-04 NOTE — Progress Notes (Signed)
ANTICOAGULATION CONSULT NOTE - Follow Up Consult  Pharmacy Consult for heparin Indication: NSTEMI  Allergies  Allergen Reactions  . Hctz [Hydrochlorothiazide] Other (See Comments)    hyponatremia    Patient Measurements: Height: 5\' 3"  (160 cm) Weight: 142 lb 13.7 oz (64.8 kg) IBW/kg (Calculated) : 52.4   Vital Signs: Temp: 98.7 F (37.1 C) (03/31 0824) Temp Source: Oral (03/31 0824) BP: 150/64 mmHg (03/31 0824) Pulse Rate: 75 (03/31 0824)  Labs:  Recent Labs  07/02/14 2300 07/03/14 0346 07/03/14 0940  07/03/14 1610 07/03/14 1957 07/04/14 0342 07/04/14 0959  HGB 10.0* 8.7*  --   --   --   --  8.2*  --   HCT 32.4* 28.4*  --   --   --   --  27.3*  --   PLT 334 296  --   --   --   --  295  --   HEPARINUNFRC  --   --   --   < >  --  0.21* 0.32 0.42  CREATININE 0.84 0.72  --   --   --   --   --   --   TROPONINI 0.11* 0.12* 0.06*  --  0.05*  --   --   --   < > = values in this interval not displayed.  Estimated Creatinine Clearance: 61 mL/min (by C-G formula based on Cr of 0.72).   Assessment: 69 yo F on heparin drip per pharmacy for troponin elevation. HL remains therapeutic at 0.42. H/H is low but stable. No bleeding noted.  Goal of Therapy:   Heparin level 0.3-0.7 units/ml Monitor platelets by anticoagulation protocol: Yes   Plan: - Continue heparin gtt at 1150 units/hr - Daily heparin level and CBC - MD - Please address planned LOT  Salome Arnt, PharmD, BCPS Pager # [phone] 07/04/2014 11:07 AM

## 2014-07-04 NOTE — Progress Notes (Signed)
Report called to RN 6E .Patient eating dinner at this time.Will transfer as soon as she is finished

## 2014-07-04 NOTE — Progress Notes (Signed)
Patient Name: Brandy Copeland Date of Encounter: 07/04/2014  Primary Cardiologist: Dr. Johnsie Cancel   Active Problems:   COPD exacerbation   NSTEMI (non-ST elevated myocardial infarction)   Anemia   Hyperglycemia    SUBJECTIVE  Breathing improving, denies any CP during this episode.   CURRENT MEDS . antiseptic oral rinse  7 mL Mouth Rinse BID  . aspirin EC  81 mg Oral Daily  . bisoprolol  10 mg Oral Daily  . clopidogrel  75 mg Oral Daily  . fesoterodine  8 mg Oral Daily  . ipratropium-albuterol  3 mL Nebulization QID  . irbesartan  150 mg Oral Daily  . isosorbide mononitrate  20 mg Oral Daily  . levofloxacin (LEVAQUIN) IV  500 mg Intravenous Q24H  . methylPREDNISolone (SOLU-MEDROL) injection  80 mg Intravenous 3 times per day  . pravastatin  20 mg Oral q1800  . sodium chloride  3 mL Intravenous Q12H  . traMADol  50 mg Oral q morning - 10a    OBJECTIVE  Filed Vitals:   07/04/14 0415 07/04/14 0500 07/04/14 0741 07/04/14 0824  BP: 167/75   150/64  Pulse: 79   75  Temp: 98.2 F (36.8 C)   98.7 F (37.1 C)  TempSrc: Oral   Oral  Resp: 16   21  Height:      Weight:  142 lb 13.7 oz (64.8 kg)    SpO2: 97%  97% 96%    Intake/Output Summary (Last 24 hours) at 07/04/14 1020 Last data filed at 07/03/14 1845  Gross per 24 hour  Intake    500 ml  Output      0 ml  Net    500 ml   Filed Weights   07/02/14 2302 07/03/14 1529 07/04/14 0500  Weight: 143 lb (64.864 kg) 143 lb 4.8 oz (65 kg) 142 lb 13.7 oz (64.8 kg)    PHYSICAL EXAM  General: Pleasant, NAD. Neuro: Alert and oriented X 3. Moves all extremities spontaneously. Psych: Normal affect. HEENT:  Normal  Neck: Supple without bruits or JVD. Lungs:  Resp regular and unlabored. Mildly diminished breath Heart: RRR no s3, s4. 1/6 systolic murmur Abdomen: Soft, non-tender, non-distended, BS + x 4.  Extremities: No clubbing, cyanosis or edema. DP/PT/Radials 2+ and equal bilaterally.  Accessory Clinical  Findings  CBC  Recent Labs  07/02/14 2300 07/03/14 0346 07/04/14 0342  WBC 9.3 6.8 7.2  NEUTROABS 5.0  --   --   HGB 10.0* 8.7* 8.2*  HCT 32.4* 28.4* 27.3*  MCV 78.3 78.7 78.9  PLT 334 296 818   Basic Metabolic Panel  Recent Labs  07/02/14 2300 07/03/14 0346  NA 138 139  K 4.6 4.1  CL 102 105  CO2 24 25  GLUCOSE 191* 135*  BUN 16 15  CREATININE 0.84 0.72  CALCIUM 9.1 8.7   Liver Function Tests  Recent Labs  07/02/14 2300 07/03/14 0346  AST 33 25  ALT 17 19  ALKPHOS 79 71  BILITOT 0.5 0.5  PROT 7.2 6.5  ALBUMIN 4.0 3.6   Cardiac Enzymes  Recent Labs  07/03/14 0346 07/03/14 0940 07/03/14 1610  TROPONINI 0.12* 0.06* 0.05*   Hemoglobin A1C  Recent Labs  07/03/14 0346  HGBA1C 5.5    TELE NSR with HR 60-80s    ECG  No new EKG  Echocardiogram 06/11/2014  LV EF: 60% -  65%  ------------------------------------------------------------------- Indications:   Murmur (R01.1).  ------------------------------------------------------------------- History:  PMH:  Coronary artery disease. Chronic obstructive  pulmonary disease. Risk factors: Tachycardia. Current tobacco use. Hypertension. Dyslipidemia.  ------------------------------------------------------------------- Study Conclusions  - Left ventricle: The cavity size was normal. There was moderate concentric hypertrophy. Systolic function was normal. The estimated ejection fraction was in the range of 60% to 65%. Wall motion was normal; there were no regional wall motion abnormalities. Features are consistent with a pseudonormal left ventricular filling pattern, with concomitant abnormal relaxation and increased filling pressure (grade 2 diastolic dysfunction). Doppler parameters are consistent with elevated ventricular end-diastolic filling pressure. - Aortic valve: Trileaflet; moderately thickened, moderately calcified leaflets. Valve mobility was restricted.  There was mild stenosis. There was trivial regurgitation. Mean gradient (S): 10 mm Hg. Peak gradient (S): 22 mm Hg. Valve area (VTI): 1.78 cm^2. Valve area (Vmax): 1.62 cm^2. Valve area (Vmean): 1.64 cm^2. - Aortic root: The aortic root was normal in size. - Mitral valve: Calcified annulus. Mildly thickened leaflets . There was trivial regurgitation. - Left atrium: The atrium was mildly dilated. - Right ventricle: The cavity size was normal. Wall thickness was normal. Systolic function was normal. - Tricuspid valve: There was mild regurgitation. - Pulmonic valve: There was no regurgitation. - Pulmonary arteries: Systolic pressure was mildly increased. PA peak pressure: 44 mm Hg (S). - Inferior vena cava: The vessel was normal in size. - Pericardium, extracardiac: There was no pericardial effusion.    Radiology/Studies  Dg Chest Port 1 View  07/02/2014   CLINICAL DATA:  Acute onset of shortness of breath. Initial encounter.  EXAM: PORTABLE CHEST - 1 VIEW  COMPARISON:  Chest radiograph performed 04/16/2014  FINDINGS: The lungs are well-aerated. Mild vascular congestion is noted. Minimal bilateral atelectasis is seen. There is no evidence of pleural effusion or pneumothorax.  The cardiomediastinal silhouette is borderline enlarged. No acute osseous abnormalities are seen.  IMPRESSION: Mild vascular congestion and borderline cardiomegaly. Minimal bilateral atelectasis seen.   Electronically Signed   By: Garald Balding M.D.   On: 07/02/2014 23:20    ASSESSMENT AND PLAN  69 year old woman with multivessel CAD being treated medically, diastolic dysfunction, mild AS, and severe COPD, whom we have been asked to evaluate due to a mild troponin elevation in the setting of a COPD exacerbation.  1. Mildly elevated trop likely demand ischemia  - denies any CP, trop trending down reassuring.   - recent echo 06/11/2014 shows EF 60-65%, no RWMA, grade 2 diastolic dysfunction.   - No further  inpatient workup expected. Given moderate dx in 2014 cath, may consider outpatient myoview at some point once COPD exacerbation resolve.   2. COPD exacerbation  3. CAD  - nonobstructive CAD by cath in 12/2012, moderate dx in all 3 major coronary artery, elevated trop felt to be demand ischemia in the setting of COPD exacerbation in 2014  4. HTN  5. Tobacco abuse: continue to smoke 1/3 ppd   Signed, Woodward Ku Pager: [phone]  History and all data above reviewed.  Patient examined.  I agree with the findings as above. No chest pain.  Troponin has trended down.  The patient exam reveals COR:RRR, systolic murmur  ,  Lungs: Decreased breath sounds  ,  Abd: Positive bowel sounds, no rebound no guarding, Ext No edema  .  All available labs, radiology testing, previous records reviewed. Agree with documented assessment and plan. Elevated troponin.  No further in patient work up.  Not a clinical scenario consistent with NSTEMI.  Stop IV Heparin and will change to DVT dose.  Call us with further questions please.  Jeneen Rinks Thalia [place]  11:28 AM  07/04/2014

## 2014-07-05 LAB — FOLATE: Folate: 7.6 ng/mL

## 2014-07-05 LAB — VITAMIN B12: Vitamin B-12: 394 pg/mL (ref 211–911)

## 2014-07-05 LAB — IRON AND TIBC
Iron: 12 ug/dL — ABNORMAL LOW (ref 42–145)
SATURATION RATIOS: 3 % — AB (ref 20–55)
TIBC: 406 ug/dL (ref 250–470)
UIBC: 394 ug/dL (ref 125–400)

## 2014-07-05 LAB — BASIC METABOLIC PANEL
Anion gap: 7 (ref 5–15)
BUN: 16 mg/dL (ref 6–23)
CO2: 25 mmol/L (ref 19–32)
CREATININE: 0.76 mg/dL (ref 0.50–1.10)
Calcium: 8.8 mg/dL (ref 8.4–10.5)
Chloride: 107 mmol/L (ref 96–112)
GFR, EST NON AFRICAN AMERICAN: 85 mL/min — AB (ref >90)
Glucose, Bld: 107 mg/dL — ABNORMAL HIGH (ref 70–99)
Potassium: 4.3 mmol/L (ref 3.5–5.1)
Sodium: 139 mmol/L (ref 135–145)

## 2014-07-05 LAB — CBC
HEMATOCRIT: 28.9 % — AB (ref 36.0–46.0)
Hemoglobin: 8.7 g/dL — ABNORMAL LOW (ref 12.0–15.0)
MCH: 23.7 pg — ABNORMAL LOW (ref 26.0–34.0)
MCHC: 30.1 g/dL (ref 30.0–36.0)
MCV: 78.7 fL (ref 78.0–100.0)
PLATELETS: 348 10*3/uL (ref 150–400)
RBC: 3.67 MIL/uL — AB (ref 3.87–5.11)
RDW: 19.9 % — AB (ref 11.5–15.5)
WBC: 10.4 10*3/uL (ref 4.0–10.5)

## 2014-07-05 LAB — FERRITIN: Ferritin: 18 ng/mL (ref 10–291)

## 2014-07-05 MED ORDER — PREDNISONE 50 MG PO TABS
60.0000 mg | ORAL_TABLET | Freq: Every day | ORAL | Status: DC
  Administered 2014-07-06: 60 mg via ORAL
  Filled 2014-07-05 (×2): qty 1

## 2014-07-05 MED ORDER — LEVOFLOXACIN 500 MG PO TABS: 500.0000 mg | ORAL_TABLET | Freq: Every day | ORAL | Status: AC

## 2014-07-05 MED ORDER — IPRATROPIUM-ALBUTEROL 0.5-2.5 (3) MG/3ML IN SOLN: 3.0000 mL | Freq: Four times a day (QID) | RESPIRATORY_TRACT | Status: AC | PRN

## 2014-07-05 MED ORDER — GUAIFENESIN-DM 100-10 MG/5ML PO SYRP
5.0000 mL | ORAL_SOLUTION | ORAL | Status: DC | PRN
  Filled 2014-07-05: qty 5

## 2014-07-05 MED ORDER — METHYLPREDNISOLONE SODIUM SUCC 125 MG IJ SOLR
60.0000 mg | INTRAMUSCULAR | Status: DC
  Administered 2014-07-05: 60 mg via INTRAVENOUS

## 2014-07-05 MED ORDER — IPRATROPIUM-ALBUTEROL 0.5-2.5 (3) MG/3ML IN SOLN: 3.0000 mL | RESPIRATORY_TRACT | Status: DC | PRN

## 2014-07-05 MED ORDER — PREDNISONE (PAK) 10 MG PO TABS: ORAL_TABLET | Freq: Every day | ORAL | Status: DC

## 2014-07-05 MED ORDER — CARVEDILOL 12.5 MG PO TABS
12.5000 mg | ORAL_TABLET | Freq: Two times a day (BID) | ORAL | Status: DC
  Administered 2014-07-05 – 2014-07-06 (×2): 12.5 mg via ORAL
  Filled 2014-07-05 (×5): qty 1

## 2014-07-05 MED ORDER — AMLODIPINE BESYLATE 10 MG PO TABS
10.0000 mg | ORAL_TABLET | Freq: Every day | ORAL | Status: DC
  Administered 2014-07-05 – 2014-07-06 (×2): 10 mg via ORAL
  Filled 2014-07-05 (×2): qty 1

## 2014-07-05 NOTE — Progress Notes (Signed)
[facility] PROGRESS NOTE  Brandy Copeland NTI:[id number] DOB: 1945-05-09 DOA: 07/02/2014 PCP: No PCP Per Patient Interim summary: 69 year old lady admitted for copd exacerbation. She denies any chest pain. Her lab work revealed elevated troponins, cardiology consulted and recommended no further work up needed at this time.   Assessment/Plan:                    Acute respiratory failure from hypoxia and hypercapnia from acute COPD Exacerbation: Admitted to step down, started her on IV steroids, and duonebs, levaquin added on. Her breathing this am is much improved and we have decreased the dose of steroids to 60 mg daily.no wheezing heard today and plan to transition to po steroids from am. Her coughing has improved. On ambualtion her oxygen sats have remained more than 95%.    NSTEMI: Cardiology consulted and she ws put on heparin drip. She denies any chest pain and her troponins are in downward trend. As per cardiology she does not need any further work up. Outpatient follow up with Dr Johnsie Cancel.   Anemia:  Normocytic. Repeat in am is stable around 8 .   accelerted hypertension: Changed to coreg and added norvasc.    Tobacco abuse: Counseling given.      Code Status: full code Family Communication: none at bedside Disposition Plan:d/c home when bp improves.    Consultants: cardiology Procedures:  none  Antibiotics:  levaquin  HPI/Subjective: No new complaints.   Objective: Filed Vitals:   07/05/14 1842  BP: 191/72  Pulse:   Temp:   Resp:     Intake/Output Summary (Last 24 hours) at 07/05/14 1854 Last data filed at 07/05/14 1300  Gross per 24 hour  Intake    840 ml  Output      0 ml  Net    840 ml   Filed Weights   07/03/14 1529 07/04/14 0500 07/05/14 0529  Weight: 65 kg (143 lb 4.8 oz) 64.8 kg (142 lb 13.7 oz) 65.635 kg (144 lb 11.2 oz)    Exam:   General:  Alert afebrile comfortable  Cardiovascular: s1s2  Respiratory: no wheezing good  air entry.   Abdomen: soft non tender non distended bowel sounds heard  Musculoskeletal: no pedal edema.   Data Reviewed: Basic Metabolic Panel:  Recent Labs Lab 07/02/14 2300 07/03/14 0346 07/05/14 0548  NA 138 139 139  K 4.6 4.1 4.3  CL 102 105 107  CO2 24 25 25   GLUCOSE 191* 135* 107*  BUN 16 15 16   CREATININE 0.84 0.72 0.76  CALCIUM 9.1 8.7 8.8   Liver Function Tests:  Recent Labs Lab 07/02/14 2300 07/03/14 0346  AST 33 25  ALT 17 19  ALKPHOS 79 71  BILITOT 0.5 0.5  PROT 7.2 6.5  ALBUMIN 4.0 3.6   No results for input(s): LIPASE, AMYLASE in the last 168 hours. No results for input(s): AMMONIA in the last 168 hours. CBC:  Recent Labs Lab 07/02/14 2300 07/03/14 0346 07/04/14 0342 07/05/14 0548  WBC 9.3 6.8 7.2 10.4  NEUTROABS 5.0  --   --   --   HGB 10.0* 8.7* 8.2* 8.7*  HCT 32.4* 28.4* 27.3* 28.9*  MCV 78.3 78.7 78.9 78.7  PLT 334 296 295 348   Cardiac Enzymes:  Recent Labs Lab 07/02/14 2300 07/03/14 0346 07/03/14 0940 07/03/14 1610  TROPONINI 0.11* 0.12* 0.06* 0.05*   BNP (last 3 results)  Recent Labs  07/02/14 2300  BNP 1323.3*    ProBNP (  last 3 results) No results for input(s): PROBNP in the last 8760 hours.  CBG: No results for input(s): GLUCAP in the last 168 hours.  Recent Results (from the past 240 hour(s))  MRSA PCR Screening     Status: None   Collection Time: 07/03/14  3:42 PM  Result Value Ref Range Status   MRSA by PCR NEGATIVE NEGATIVE Final    Comment:        The GeneXpert MRSA Assay (FDA approved for NASAL specimens only), is one component of a comprehensive MRSA colonization surveillance program. It is not intended to diagnose MRSA infection nor to guide or monitor treatment for MRSA infections.      Studies: No results found.  Scheduled Meds: . amLODipine  10 mg Oral Daily  . antiseptic oral rinse  7 mL Mouth Rinse BID  . aspirin EC  81 mg Oral Daily  . bisoprolol  10 mg Oral Daily  .  clopidogrel  75 mg Oral Daily  . fesoterodine  8 mg Oral Daily  . heparin subcutaneous  5,000 Units Subcutaneous 3 times per day  . irbesartan  150 mg Oral Daily  . isosorbide mononitrate  20 mg Oral BID  . levofloxacin  500 mg Oral Daily  . methylPREDNISolone (SOLU-MEDROL) injection  60 mg Intravenous Q24H  . pravastatin  20 mg Oral q1800  . sodium chloride  3 mL Intravenous Q12H  . traMADol  50 mg Oral q morning - 10a   Continuous Infusions:    Active Problems:   COPD exacerbation   Anemia   Hyperglycemia   Acute respiratory failure, unspecified whether with hypoxia or hypercapnia   Elevated troponin    Time spent: 25 min    Tranise Forrest  [facility] Pager [phone] If 7PM-7AM, please contact night-coverage at [email or web address], password [facility] 07/05/2014, 6:54 PM  LOS: 2 days

## 2014-07-05 NOTE — Progress Notes (Signed)
UR Completed.  [phone]  

## 2014-07-05 NOTE — Discharge Summary (Signed)
Physician Discharge Summary  Brandy Copeland RDE:[id number] DOB: 02-02-1946 DOA: 07/02/2014  PCP: No PCP Per Patient  Admit date: 07/02/2014 Discharge date: 07/05/2014  Time spent: 30 minutes  Recommendations for Outpatient Follow-up:  1. Follow up with Dr Johnsie Cancel in two week.  2. Follow up with PCP in one week.   Discharge Diagnoses:  Active Problems:   COPD exacerbation   Anemia   Hyperglycemia   Acute respiratory failure, unspecified whether with hypoxia or hypercapnia   Elevated troponin   Discharge Condition:improved  Diet recommendation: low sodium diet.   Filed Weights   07/03/14 1529 07/04/14 0500 07/05/14 0529  Weight: 65 kg (143 lb 4.8 oz) 64.8 kg (142 lb 13.7 oz) 65.635 kg (144 lb 11.2 oz)    History of present illness:  69 year old lady admitted for copd exacerbation. She denies any chest pain. Her lab work revealed elevated troponins, cardiology consulted and recommended no further work up needed at this time.    Hospital Course:  Acute respiratory failure from hypoxia and hypercapnia from acute COPD Exacerbation: Admitted to step down, started her on IV steroids, and duonebs, levaquin added on. Her breathing this am is much improved and we have decreased the dose of steroids to 60 mg daily.no wheezing heard today and plan to transition to po steroid on discharge. . Her coughing has improved. On ambualtion her oxygen sats have remained more than 95%.    NSTEMI: Cardiology consulted and she ws put on heparin drip. She denies any chest pain and her troponins are in downward trend. As per cardiology she does not need any further work up. Outpatient follow up with Dr Johnsie Cancel. Heparin stopped.   Anemia:  Normocytic. Repeat in am is stable around 8 .   accelerted hypertension: Changed to coreg and added norvasc.    Tobacco abuse: Counseling given.   Procedures:  none  Consultations:  cardiology  Discharge Exam: Filed Vitals:   07/05/14 1000  BP:  132/80  Pulse: 77  Temp: 99 F (37.2 C)  Resp: 18    General: alert afebrile comfortable Cardiovascular: s1s2 Respiratory: ctab  Discharge Instructions   Discharge Instructions    Diet - low sodium heart healthy    Complete by:  As directed      Discharge instructions    Complete by:  As directed   Follow up with PCP in one week Please follow up with cardiology as recommended.          Current Discharge Medication List    START taking these medications   Details  levofloxacin (LEVAQUIN) 500 MG tablet Take 1 tablet (500 mg total) by mouth daily. Qty: 4 tablet, Refills: 0    predniSONE (STERAPRED UNI-PAK) 10 MG tablet Take by mouth daily. Prednisone 60 mg daily for 3 days followed by Prednisone 40 mg daily for 3 days followed by Prednisone 20 mg daily  For 3 days and stop. Qty: 36 tablet, Refills: 0      CONTINUE these medications which have CHANGED   Details  ipratropium-albuterol (DUONEB) 0.5-2.5 (3) MG/3ML SOLN Take 3 mLs by nebulization every 6 (six) hours as needed. Qty: 360 mL, Refills: 2      CONTINUE these medications which have NOT CHANGED   Details  aspirin EC 81 MG tablet Take 1 tablet (81 mg total) by mouth daily. Qty: 30 tablet, Refills: 11   Associated Diagnoses: Coronary atherosclerosis of native coronary artery    bisoprolol (ZEBETA) 10 MG tablet Take 1 tablet (10  mg total) by mouth daily. Qty: 30 tablet, Refills: 11    clopidogrel (PLAVIX) 75 MG tablet 1 TABLET BY MOUTH EVERY MORNING WITH BREAKFAST Qty: 30 tablet, Refills: 0    dextromethorphan-guaiFENesin (MUCINEX DM) 30-600 MG per 12 hr tablet Take 1 tablet by mouth 2 (two) times daily as needed for cough.    famciclovir (FAMVIR) 500 MG tablet Take 500 mg by mouth 3 (three) times daily.    isosorbide mononitrate (ISMO,MONOKET) 20 MG tablet Take 1/2 tab by mouth every morning Refills: 99    lovastatin (MEVACOR) 20 MG tablet Take 1 tablet (20 mg total) by mouth at bedtime. Qty: 30 tablet,  Refills: 2    nabumetone (RELAFEN) 750 MG tablet Take 750 mg by mouth daily. Refills: 1    oxybutynin (DITROPAN) 5 MG tablet Take 5 mg by mouth at bedtime.    TOVIAZ 8 MG TB24 tablet Take 8 mg by mouth daily. Refills: 3    traMADol (ULTRAM) 50 MG tablet Take 50 mg by mouth every morning.     valsartan (DIOVAN) 160 MG tablet Take 1 tablet (160 mg total) by mouth 2 (two) times daily. 2 daily Qty: 60 tablet, Refills: 11    NITROSTAT 0.4 MG SL tablet 1 TABLET UNDER TONGUE IF NEEDED EVERY 5 MINUTES FOR CHEST FOR PAIN FOR 3 DOSES IF NO RELIEF AFTER 3RD DOSE CALL 911. Qty: 25 tablet, Refills: 0      STOP taking these medications     carvedilol (COREG) 6.25 MG tablet        Allergies  Allergen Reactions  . Hctz [Hydrochlorothiazide] Other (See Comments)    hyponatremia   Follow-up Information    Follow up with Jenkins Rouge, MD. Schedule an appointment as soon as possible for a visit in 2 weeks.   Specialty:  Cardiology   Why:  FOR STRESS TEST   Contact information:   [address] [place] [address] [phone]        The results of significant diagnostics from this hospitalization (including imaging, microbiology, ancillary and laboratory) are listed below for reference.    Significant Diagnostic Studies: Dg Chest Port 1 View  07/02/2014   CLINICAL DATA:  Acute onset of shortness of breath. Initial encounter.  EXAM: PORTABLE CHEST - 1 VIEW  COMPARISON:  Chest radiograph performed 04/16/2014  FINDINGS: The lungs are well-aerated. Mild vascular congestion is noted. Minimal bilateral atelectasis is seen. There is no evidence of pleural effusion or pneumothorax.  The cardiomediastinal silhouette is borderline enlarged. No acute osseous abnormalities are seen.  IMPRESSION: Mild vascular congestion and borderline cardiomegaly. Minimal bilateral atelectasis seen.   Electronically Signed   By: Garald Balding M.D.   On: 07/02/2014 23:20    Microbiology: Recent  Results (from the past 240 hour(s))  MRSA PCR Screening     Status: None   Collection Time: 07/03/14  3:42 PM  Result Value Ref Range Status   MRSA by PCR NEGATIVE NEGATIVE Final    Comment:        The GeneXpert MRSA Assay (FDA approved for NASAL specimens only), is one component of a comprehensive MRSA colonization surveillance program. It is not intended to diagnose MRSA infection nor to guide or monitor treatment for MRSA infections.      Labs: Basic Metabolic Panel:  Recent Labs Lab 07/02/14 2300 07/03/14 0346 07/05/14 0548  NA 138 139 139  K 4.6 4.1 4.3  CL 102 105 107  CO2 24 25 25  GLUCOSE 191* 135* 107*  BUN 16 15 16   CREATININE 0.84 0.72 0.76  CALCIUM 9.1 8.7 8.8   Liver Function Tests:  Recent Labs Lab 07/02/14 2300 07/03/14 0346  AST 33 25  ALT 17 19  ALKPHOS 79 71  BILITOT 0.5 0.5  PROT 7.2 6.5  ALBUMIN 4.0 3.6   No results for input(s): LIPASE, AMYLASE in the last 168 hours. No results for input(s): AMMONIA in the last 168 hours. CBC:  Recent Labs Lab 07/02/14 2300 07/03/14 0346 07/04/14 0342 07/05/14 0548  WBC 9.3 6.8 7.2 10.4  NEUTROABS 5.0  --   --   --   HGB 10.0* 8.7* 8.2* 8.7*  HCT 32.4* 28.4* 27.3* 28.9*  MCV 78.3 78.7 78.9 78.7  PLT 334 296 295 348   Cardiac Enzymes:  Recent Labs Lab 07/02/14 2300 07/03/14 0346 07/03/14 0940 07/03/14 1610  TROPONINI 0.11* 0.12* 0.06* 0.05*   BNP: BNP (last 3 results)  Recent Labs  07/02/14 2300  BNP 1323.3*    ProBNP (last 3 results) No results for input(s): PROBNP in the last 8760 hours.  CBG: No results for input(s): GLUCAP in the last 168 hours.     SignedHosie Poisson  [facility] 07/05/2014, 3:24 PM

## 2014-07-05 NOTE — Progress Notes (Signed)
Pt requested to change duoneb to PRN as that is her home frequency. Pt states she feels "99.99% better and does not need them now". Will call if needed. RT will continue to monitor.

## 2014-07-05 NOTE — Progress Notes (Signed)
Pt systollic bp 334, paged Dr. Karleen Hampshire, pt to be discharged home, gave imdur 20, any further orders.

## 2014-07-05 NOTE — Progress Notes (Signed)
Pt ambulated hall with assistance without difficulty.  Pt O2 sat 94% on RA at rest after ambulating.

## 2014-07-05 NOTE — Care Management Note (Signed)
CARE MANAGEMENT NOTE 07/05/2014  Patient:  JALIZA, SEIFRIED   Account Number:  [id number]  Date Initiated:  07/05/2014  Documentation initiated by:  Abiel Antrim  Subjective/Objective Assessment:   CM following for progression and d/c planning.     Action/Plan:   07/05/14 Met with pt who lives alone and has [place] thru her Medicaid with daily hh aide services.   Anticipated DC Date:  07/07/2014   Anticipated DC Plan:  [place]         Choice offered to / List presented to:          [facility] arranged  HH-8 PCS/PERSONAL CARE SERVICES      Status of service:  In process, will continue to follow Medicare Important Message given?  YES (If response is "NO", the following Medicare IM given date fields will be blank) Date Medicare IM given:  07/05/2014 Medicare IM given by:  Carlon Davidson Date Additional Medicare IM given:   Additional Medicare IM given by:    Discharge Disposition:    Per UR Regulation:    If discussed at Long Length of Stay Meetings, dates discussed:    Comments:

## 2014-07-05 NOTE — Progress Notes (Signed)
Pt re check bp 191/72 cancel discharge, replace IV for IV bp meds.

## 2014-07-06 LAB — CBC
HEMATOCRIT: 29.1 % — AB (ref 36.0–46.0)
HEMOGLOBIN: 8.9 g/dL — AB (ref 12.0–15.0)
MCH: 23.8 pg — ABNORMAL LOW (ref 26.0–34.0)
MCHC: 30.6 g/dL (ref 30.0–36.0)
MCV: 77.8 fL — AB (ref 78.0–100.0)
Platelets: 330 10*3/uL (ref 150–400)
RBC: 3.74 MIL/uL — ABNORMAL LOW (ref 3.87–5.11)
RDW: 19.8 % — AB (ref 11.5–15.5)
WBC: 9.2 10*3/uL (ref 4.0–10.5)

## 2014-07-06 MED ORDER — HYDRALAZINE HCL 20 MG/ML IJ SOLN
5.0000 mg | Freq: Once | INTRAMUSCULAR | Status: AC
  Administered 2014-07-06: 5 mg via INTRAVENOUS
  Filled 2014-07-06: qty 1

## 2014-07-06 MED ORDER — AMLODIPINE BESYLATE 10 MG PO TABS: 10.0000 mg | ORAL_TABLET | Freq: Every day | ORAL | Status: AC

## 2014-07-06 MED ORDER — CARVEDILOL 12.5 MG PO TABS: 12.5000 mg | ORAL_TABLET | Freq: Two times a day (BID) | ORAL | Status: AC

## 2014-07-06 NOTE — Evaluation (Signed)
Physical Therapy Evaluation Patient Details Name: Brandy Copeland MRN: [id number] DOB: [birth year] Today's Date: 07/06/2014   History of Present Illness  Pt is a 69 yo female with Copd, cad, apparently c/o dyspnea 1 day PTA with cough with yellow sputum.Pt was admitted with respiratory distress.   Clinical Impression  Pt admitted with above diagnosis. Pt currently with functional limitations due to the deficits listed below (see PT Problem List). At the time of PT eval pt was able to perform transfers and ambulation with supervision to min guard assist. Tolerance for functional activity is low and pt requires standing rest breaks occasionally during gait training. Pt was cued for breathing techniques to maintain O2 sats >90%. Occasionally sats decreased 87-89%. Pt will benefit from skilled PT to increase their independence and safety with mobility to allow discharge to the venue listed below.       Follow Up Recommendations Home health PT    Equipment Recommendations  None recommended by PT    Recommendations for Other Services       Precautions / Restrictions Precautions Precautions: Fall Restrictions Weight Bearing Restrictions: No      Mobility  Bed Mobility Overal bed mobility: Needs Assistance Bed Mobility: Supine to Sit     Supine to sit: Supervision     General bed mobility comments: Pt able to transition to EOB with no assistance. Increased time and use of bed rails required. Supervision for safety.   Transfers Overall transfer level: Needs assistance Equipment used: Rolling walker (2 wheeled) Transfers: Sit to/from Stand Sit to Stand: Supervision         General transfer comment: Pt was able to power-up to full standing position without assistance. Supervision for safety and need for RW for UE support once standing.   Ambulation/Gait Ambulation/Gait assistance: Min guard Ambulation Distance (Feet): 225 Feet Assistive device: Rolling walker (2 wheeled) Gait  Pattern/deviations: Step-through pattern;Decreased stride length;Trendelenburg;Trunk flexed Gait velocity: Decreased Gait velocity interpretation: Below normal speed for age/gender General Gait Details: Pt was able to ambulate with RW and no unsteadiness noted. Min guard assist was provided during some aspects of gait training - pt required VC's for general safety awareness.  Stairs            Wheelchair Mobility    Modified Rankin (Stroke Patients Only)       Balance Overall balance assessment: Needs assistance Sitting-balance support: Feet supported;No upper extremity supported Sitting balance-Leahy Scale: Good     Standing balance support: No upper extremity supported;During functional activity Standing balance-Leahy Scale: Fair                               Pertinent Vitals/Pain Pain Assessment: No/denies pain    Home Living Family/patient expects to be discharged to:: Private residence Living Arrangements: Alone Available Help at Discharge: Friend(s);Personal care attendant;Family;Available PRN/intermittently Type of Home: Apartment Home Access: Elevator     Home Layout: One level Home Equipment: Walker - 2 wheels;Shower seat      Prior Function Level of Independence: Needs assistance   Gait / Transfers Assistance Needed: Uses a RW at all times  ADL's / Homemaking Assistance Needed: Home health aide during the week to assist with bathing and some dressing tasks. States the aide assists her with cleaning but she likes to try and cook as much as she can.         Hand Dominance   Dominant Hand: Right    Extremity/Trunk  Assessment   Upper Extremity Assessment: Defer to OT evaluation           Lower Extremity Assessment: Generalized weakness (Arthritic knees)      Cervical / Trunk Assessment: Kyphotic  Communication   Communication: No difficulties  Cognition Arousal/Alertness: Awake/alert Behavior During Therapy: WFL for tasks  assessed/performed Overall Cognitive Status: Within Functional Limits for tasks assessed                      General Comments      Exercises        Assessment/Plan    PT Assessment Patient needs continued PT services  PT Diagnosis Difficulty walking;Generalized weakness   PT Problem List Decreased strength;Decreased range of motion;Decreased activity tolerance;Decreased balance;Decreased mobility  PT Treatment Interventions DME instruction;Gait training;Stair training;Therapeutic activities;Functional mobility training;Therapeutic exercise;Neuromuscular re-education;Patient/family education   PT Goals (Current goals can be found in the Care Plan section) Acute Rehab PT Goals Patient Stated Goal: Home as soon as possible PT Goal Formulation: With patient Time For Goal Achievement: 07/13/14 Potential to Achieve Goals: Good    Frequency Min 3X/week   Barriers to discharge Decreased caregiver support Pt is alone at times, although appears to have a good support system with friends in her building and an [employer] aide that comes in    Co-evaluation               End of [place] During Treatment: Gait belt Activity Tolerance: Patient tolerated treatment well Patient left: in chair;with call bell/phone within reach Nurse Communication: Mobility status         Time: 0160-1093 PT Time Calculation (min) (ACUTE ONLY): 21 min   Charges:   PT Evaluation $Initial PT Evaluation Tier I: 1 Procedure     PT G CodesRolinda Roan July 12, 2014, 11:13 AM  Rolinda Roan, PT, DPT Acute Rehabilitation Services Pager: [phone]

## 2014-07-06 NOTE — Progress Notes (Signed)
Discharge instructions and medications discussed with patient.  All questions answered.  

## 2014-07-06 NOTE — Clinical Social Work Note (Signed)
Patient medically stable for discharge and cab transport requested. CSW provided cab voucher to nurse for patient to be transported home today.  Eleanor Gatliff Givens, MSW, LCSW Licensed Clinical Social Worker [place] [phone]

## 2014-07-08 DIAGNOSIS — E784 Other hyperlipidemia: Secondary | ICD-10-CM | POA: Diagnosis not present

## 2014-07-08 DIAGNOSIS — I10 Essential (primary) hypertension: Secondary | ICD-10-CM | POA: Diagnosis not present

## 2014-07-08 DIAGNOSIS — J449 Chronic obstructive pulmonary disease, unspecified: Secondary | ICD-10-CM | POA: Diagnosis not present

## 2014-07-08 DIAGNOSIS — E785 Hyperlipidemia, unspecified: Secondary | ICD-10-CM | POA: Diagnosis not present

## 2014-07-08 NOTE — Care Management Note (Signed)
                Late Entry        CARE MANAGEMENT NOTE 07/08/2014  Patient:  Brandy Copeland, Brandy Copeland   Account Number:  [id number]  Date Initiated:  07/05/2014  Documentation initiated by:  Kolleen Ochsner  Subjective/Objective Assessment:   CM following for progression and d/c planning.     Action/Plan:   07/05/14 Met with pt who lives alone and has [place] thru her Medicaid with daily hh aide services.   Anticipated DC Date:  07/06/2014   Anticipated DC Plan:  [place]         Choice offered to / List presented to:          [facility] arranged  HH-8 PCS/PERSONAL CARE SERVICES      Status of service:  In process, will continue to follow Medicare Important Message given?  YES (If response is "NO", the following Medicare IM given date fields will be blank) Date Medicare IM given:  07/05/2014 Medicare IM given by:  Vietta Bonifield Date Additional Medicare IM given:   Additional Medicare IM given by:    Discharge Disposition:    Per UR Regulation:    If discussed at Long Length of Stay Meetings, dates discussed:    Comments:  07/08/2014 Met with pt re d/c needs during hospitalization. Noted request for PCP, however this pt has multiple insurances therefore is able to obtain her own PCP. Pt alert, able to use a phone and has a [place] aide to assist with personal needs. This pt also as mentioned has a hh aide from [place] which is provide thru Medicaid and required a PCP to complete an application in order for this pt to receive this serve, therefore the pt has had a PCP in the past and most likely has one now in order for [facility] services to continue.  CRoyal RN MPH, case manager, [phone]

## 2014-07-10 DIAGNOSIS — E785 Hyperlipidemia, unspecified: Secondary | ICD-10-CM | POA: Diagnosis not present

## 2014-07-10 DIAGNOSIS — J449 Chronic obstructive pulmonary disease, unspecified: Secondary | ICD-10-CM | POA: Diagnosis not present

## 2014-07-10 DIAGNOSIS — E784 Other hyperlipidemia: Secondary | ICD-10-CM | POA: Diagnosis not present

## 2014-07-10 DIAGNOSIS — I10 Essential (primary) hypertension: Secondary | ICD-10-CM | POA: Diagnosis not present

## 2014-07-12 DIAGNOSIS — J449 Chronic obstructive pulmonary disease, unspecified: Secondary | ICD-10-CM | POA: Diagnosis not present

## 2014-07-12 DIAGNOSIS — I10 Essential (primary) hypertension: Secondary | ICD-10-CM | POA: Diagnosis not present

## 2014-07-12 DIAGNOSIS — E785 Hyperlipidemia, unspecified: Secondary | ICD-10-CM | POA: Diagnosis not present

## 2014-07-12 DIAGNOSIS — E784 Other hyperlipidemia: Secondary | ICD-10-CM | POA: Diagnosis not present

## 2014-07-15 DIAGNOSIS — I10 Essential (primary) hypertension: Secondary | ICD-10-CM | POA: Diagnosis not present

## 2014-07-15 DIAGNOSIS — J449 Chronic obstructive pulmonary disease, unspecified: Secondary | ICD-10-CM | POA: Diagnosis not present

## 2014-07-15 DIAGNOSIS — E785 Hyperlipidemia, unspecified: Secondary | ICD-10-CM | POA: Diagnosis not present

## 2014-07-15 DIAGNOSIS — E784 Other hyperlipidemia: Secondary | ICD-10-CM | POA: Diagnosis not present

## 2014-07-17 DIAGNOSIS — I10 Essential (primary) hypertension: Secondary | ICD-10-CM | POA: Diagnosis not present

## 2014-07-17 DIAGNOSIS — E784 Other hyperlipidemia: Secondary | ICD-10-CM | POA: Diagnosis not present

## 2014-07-17 DIAGNOSIS — E785 Hyperlipidemia, unspecified: Secondary | ICD-10-CM | POA: Diagnosis not present

## 2014-07-17 DIAGNOSIS — J449 Chronic obstructive pulmonary disease, unspecified: Secondary | ICD-10-CM | POA: Diagnosis not present

## 2014-07-19 DIAGNOSIS — E785 Hyperlipidemia, unspecified: Secondary | ICD-10-CM | POA: Diagnosis not present

## 2014-07-19 DIAGNOSIS — J449 Chronic obstructive pulmonary disease, unspecified: Secondary | ICD-10-CM | POA: Diagnosis not present

## 2014-07-19 DIAGNOSIS — I10 Essential (primary) hypertension: Secondary | ICD-10-CM | POA: Diagnosis not present

## 2014-07-19 DIAGNOSIS — E784 Other hyperlipidemia: Secondary | ICD-10-CM | POA: Diagnosis not present

## 2014-07-21 DIAGNOSIS — J449 Chronic obstructive pulmonary disease, unspecified: Secondary | ICD-10-CM | POA: Diagnosis not present

## 2014-07-21 DIAGNOSIS — R32 Unspecified urinary incontinence: Secondary | ICD-10-CM | POA: Diagnosis not present

## 2014-07-22 DIAGNOSIS — E785 Hyperlipidemia, unspecified: Secondary | ICD-10-CM | POA: Diagnosis not present

## 2014-07-22 DIAGNOSIS — J449 Chronic obstructive pulmonary disease, unspecified: Secondary | ICD-10-CM | POA: Diagnosis not present

## 2014-07-22 DIAGNOSIS — I10 Essential (primary) hypertension: Secondary | ICD-10-CM | POA: Diagnosis not present

## 2014-07-22 DIAGNOSIS — E784 Other hyperlipidemia: Secondary | ICD-10-CM | POA: Diagnosis not present

## 2014-07-24 DIAGNOSIS — E784 Other hyperlipidemia: Secondary | ICD-10-CM | POA: Diagnosis not present

## 2014-07-24 DIAGNOSIS — E785 Hyperlipidemia, unspecified: Secondary | ICD-10-CM | POA: Diagnosis not present

## 2014-07-24 DIAGNOSIS — I10 Essential (primary) hypertension: Secondary | ICD-10-CM | POA: Diagnosis not present

## 2014-07-24 DIAGNOSIS — J449 Chronic obstructive pulmonary disease, unspecified: Secondary | ICD-10-CM | POA: Diagnosis not present

## 2014-07-26 DIAGNOSIS — J449 Chronic obstructive pulmonary disease, unspecified: Secondary | ICD-10-CM | POA: Diagnosis not present

## 2014-07-26 DIAGNOSIS — I10 Essential (primary) hypertension: Secondary | ICD-10-CM | POA: Diagnosis not present

## 2014-07-26 DIAGNOSIS — E785 Hyperlipidemia, unspecified: Secondary | ICD-10-CM | POA: Diagnosis not present

## 2014-07-26 DIAGNOSIS — E784 Other hyperlipidemia: Secondary | ICD-10-CM | POA: Diagnosis not present

## 2014-07-27 DIAGNOSIS — I10 Essential (primary) hypertension: Secondary | ICD-10-CM | POA: Diagnosis not present

## 2014-07-27 DIAGNOSIS — J449 Chronic obstructive pulmonary disease, unspecified: Secondary | ICD-10-CM | POA: Diagnosis not present

## 2014-07-27 DIAGNOSIS — E785 Hyperlipidemia, unspecified: Secondary | ICD-10-CM | POA: Diagnosis not present

## 2014-07-27 DIAGNOSIS — E784 Other hyperlipidemia: Secondary | ICD-10-CM | POA: Diagnosis not present

## 2014-07-28 DIAGNOSIS — E785 Hyperlipidemia, unspecified: Secondary | ICD-10-CM | POA: Diagnosis not present

## 2014-07-28 DIAGNOSIS — E784 Other hyperlipidemia: Secondary | ICD-10-CM | POA: Diagnosis not present

## 2014-07-28 DIAGNOSIS — J449 Chronic obstructive pulmonary disease, unspecified: Secondary | ICD-10-CM | POA: Diagnosis not present

## 2014-07-28 DIAGNOSIS — I10 Essential (primary) hypertension: Secondary | ICD-10-CM | POA: Diagnosis not present

## 2014-07-29 DIAGNOSIS — E785 Hyperlipidemia, unspecified: Secondary | ICD-10-CM | POA: Diagnosis not present

## 2014-07-29 DIAGNOSIS — E784 Other hyperlipidemia: Secondary | ICD-10-CM | POA: Diagnosis not present

## 2014-07-29 DIAGNOSIS — I10 Essential (primary) hypertension: Secondary | ICD-10-CM | POA: Diagnosis not present

## 2014-07-29 DIAGNOSIS — J449 Chronic obstructive pulmonary disease, unspecified: Secondary | ICD-10-CM | POA: Diagnosis not present

## 2014-07-30 DIAGNOSIS — E785 Hyperlipidemia, unspecified: Secondary | ICD-10-CM | POA: Diagnosis not present

## 2014-07-30 DIAGNOSIS — I10 Essential (primary) hypertension: Secondary | ICD-10-CM | POA: Diagnosis not present

## 2014-07-30 DIAGNOSIS — E784 Other hyperlipidemia: Secondary | ICD-10-CM | POA: Diagnosis not present

## 2014-07-30 DIAGNOSIS — J449 Chronic obstructive pulmonary disease, unspecified: Secondary | ICD-10-CM | POA: Diagnosis not present

## 2014-07-31 DIAGNOSIS — J449 Chronic obstructive pulmonary disease, unspecified: Secondary | ICD-10-CM | POA: Diagnosis not present

## 2014-07-31 DIAGNOSIS — E785 Hyperlipidemia, unspecified: Secondary | ICD-10-CM | POA: Diagnosis not present

## 2014-07-31 DIAGNOSIS — I10 Essential (primary) hypertension: Secondary | ICD-10-CM | POA: Diagnosis not present

## 2014-07-31 DIAGNOSIS — E784 Other hyperlipidemia: Secondary | ICD-10-CM | POA: Diagnosis not present

## 2014-08-01 DIAGNOSIS — E785 Hyperlipidemia, unspecified: Secondary | ICD-10-CM | POA: Diagnosis not present

## 2014-08-01 DIAGNOSIS — I10 Essential (primary) hypertension: Secondary | ICD-10-CM | POA: Diagnosis not present

## 2014-08-01 DIAGNOSIS — E784 Other hyperlipidemia: Secondary | ICD-10-CM | POA: Diagnosis not present

## 2014-08-01 DIAGNOSIS — J449 Chronic obstructive pulmonary disease, unspecified: Secondary | ICD-10-CM | POA: Diagnosis not present

## 2014-08-02 DIAGNOSIS — J449 Chronic obstructive pulmonary disease, unspecified: Secondary | ICD-10-CM | POA: Diagnosis not present

## 2014-08-02 DIAGNOSIS — E784 Other hyperlipidemia: Secondary | ICD-10-CM | POA: Diagnosis not present

## 2014-08-02 DIAGNOSIS — E785 Hyperlipidemia, unspecified: Secondary | ICD-10-CM | POA: Diagnosis not present

## 2014-08-02 DIAGNOSIS — I10 Essential (primary) hypertension: Secondary | ICD-10-CM | POA: Diagnosis not present

## 2014-08-03 DIAGNOSIS — J449 Chronic obstructive pulmonary disease, unspecified: Secondary | ICD-10-CM | POA: Diagnosis not present

## 2014-08-03 DIAGNOSIS — E785 Hyperlipidemia, unspecified: Secondary | ICD-10-CM | POA: Diagnosis not present

## 2014-08-03 DIAGNOSIS — I10 Essential (primary) hypertension: Secondary | ICD-10-CM | POA: Diagnosis not present

## 2014-08-03 DIAGNOSIS — E784 Other hyperlipidemia: Secondary | ICD-10-CM | POA: Diagnosis not present

## 2014-08-04 DIAGNOSIS — E784 Other hyperlipidemia: Secondary | ICD-10-CM | POA: Diagnosis not present

## 2014-08-04 DIAGNOSIS — J449 Chronic obstructive pulmonary disease, unspecified: Secondary | ICD-10-CM | POA: Diagnosis not present

## 2014-08-04 DIAGNOSIS — I10 Essential (primary) hypertension: Secondary | ICD-10-CM | POA: Diagnosis not present

## 2014-08-04 DIAGNOSIS — E785 Hyperlipidemia, unspecified: Secondary | ICD-10-CM | POA: Diagnosis not present

## 2014-08-05 DIAGNOSIS — J449 Chronic obstructive pulmonary disease, unspecified: Secondary | ICD-10-CM | POA: Diagnosis not present

## 2014-08-05 DIAGNOSIS — E785 Hyperlipidemia, unspecified: Secondary | ICD-10-CM | POA: Diagnosis not present

## 2014-08-05 DIAGNOSIS — I10 Essential (primary) hypertension: Secondary | ICD-10-CM | POA: Diagnosis not present

## 2014-08-05 DIAGNOSIS — E784 Other hyperlipidemia: Secondary | ICD-10-CM | POA: Diagnosis not present

## 2014-08-06 DIAGNOSIS — I10 Essential (primary) hypertension: Secondary | ICD-10-CM | POA: Diagnosis not present

## 2014-08-06 DIAGNOSIS — R32 Unspecified urinary incontinence: Secondary | ICD-10-CM | POA: Diagnosis not present

## 2014-08-06 DIAGNOSIS — E784 Other hyperlipidemia: Secondary | ICD-10-CM | POA: Diagnosis not present

## 2014-08-06 DIAGNOSIS — J449 Chronic obstructive pulmonary disease, unspecified: Secondary | ICD-10-CM | POA: Diagnosis not present

## 2014-08-06 DIAGNOSIS — E785 Hyperlipidemia, unspecified: Secondary | ICD-10-CM | POA: Diagnosis not present

## 2014-08-07 DIAGNOSIS — E785 Hyperlipidemia, unspecified: Secondary | ICD-10-CM | POA: Diagnosis not present

## 2014-08-07 DIAGNOSIS — J449 Chronic obstructive pulmonary disease, unspecified: Secondary | ICD-10-CM | POA: Diagnosis not present

## 2014-08-07 DIAGNOSIS — I10 Essential (primary) hypertension: Secondary | ICD-10-CM | POA: Diagnosis not present

## 2014-08-07 DIAGNOSIS — E784 Other hyperlipidemia: Secondary | ICD-10-CM | POA: Diagnosis not present

## 2014-08-08 DIAGNOSIS — E784 Other hyperlipidemia: Secondary | ICD-10-CM | POA: Diagnosis not present

## 2014-08-08 DIAGNOSIS — J449 Chronic obstructive pulmonary disease, unspecified: Secondary | ICD-10-CM | POA: Diagnosis not present

## 2014-08-08 DIAGNOSIS — I10 Essential (primary) hypertension: Secondary | ICD-10-CM | POA: Diagnosis not present

## 2014-08-08 DIAGNOSIS — E785 Hyperlipidemia, unspecified: Secondary | ICD-10-CM | POA: Diagnosis not present

## 2014-08-09 DIAGNOSIS — J449 Chronic obstructive pulmonary disease, unspecified: Secondary | ICD-10-CM | POA: Diagnosis not present

## 2014-08-09 DIAGNOSIS — E785 Hyperlipidemia, unspecified: Secondary | ICD-10-CM | POA: Diagnosis not present

## 2014-08-09 DIAGNOSIS — I10 Essential (primary) hypertension: Secondary | ICD-10-CM | POA: Diagnosis not present

## 2014-08-09 DIAGNOSIS — E784 Other hyperlipidemia: Secondary | ICD-10-CM | POA: Diagnosis not present

## 2014-08-10 DIAGNOSIS — J449 Chronic obstructive pulmonary disease, unspecified: Secondary | ICD-10-CM | POA: Diagnosis not present

## 2014-08-10 DIAGNOSIS — E785 Hyperlipidemia, unspecified: Secondary | ICD-10-CM | POA: Diagnosis not present

## 2014-08-10 DIAGNOSIS — E784 Other hyperlipidemia: Secondary | ICD-10-CM | POA: Diagnosis not present

## 2014-08-10 DIAGNOSIS — I10 Essential (primary) hypertension: Secondary | ICD-10-CM | POA: Diagnosis not present

## 2014-08-11 DIAGNOSIS — I10 Essential (primary) hypertension: Secondary | ICD-10-CM | POA: Diagnosis not present

## 2014-08-11 DIAGNOSIS — J449 Chronic obstructive pulmonary disease, unspecified: Secondary | ICD-10-CM | POA: Diagnosis not present

## 2014-08-11 DIAGNOSIS — E785 Hyperlipidemia, unspecified: Secondary | ICD-10-CM | POA: Diagnosis not present

## 2014-08-11 DIAGNOSIS — E784 Other hyperlipidemia: Secondary | ICD-10-CM | POA: Diagnosis not present

## 2014-08-12 DIAGNOSIS — E784 Other hyperlipidemia: Secondary | ICD-10-CM | POA: Diagnosis not present

## 2014-08-12 DIAGNOSIS — I10 Essential (primary) hypertension: Secondary | ICD-10-CM | POA: Diagnosis not present

## 2014-08-12 DIAGNOSIS — E785 Hyperlipidemia, unspecified: Secondary | ICD-10-CM | POA: Diagnosis not present

## 2014-08-12 DIAGNOSIS — J449 Chronic obstructive pulmonary disease, unspecified: Secondary | ICD-10-CM | POA: Diagnosis not present

## 2014-08-13 DIAGNOSIS — I10 Essential (primary) hypertension: Secondary | ICD-10-CM | POA: Diagnosis not present

## 2014-08-13 DIAGNOSIS — E785 Hyperlipidemia, unspecified: Secondary | ICD-10-CM | POA: Diagnosis not present

## 2014-08-13 DIAGNOSIS — E784 Other hyperlipidemia: Secondary | ICD-10-CM | POA: Diagnosis not present

## 2014-08-13 DIAGNOSIS — J449 Chronic obstructive pulmonary disease, unspecified: Secondary | ICD-10-CM | POA: Diagnosis not present

## 2014-08-14 DIAGNOSIS — E785 Hyperlipidemia, unspecified: Secondary | ICD-10-CM | POA: Diagnosis not present

## 2014-08-14 DIAGNOSIS — E784 Other hyperlipidemia: Secondary | ICD-10-CM | POA: Diagnosis not present

## 2014-08-14 DIAGNOSIS — J449 Chronic obstructive pulmonary disease, unspecified: Secondary | ICD-10-CM | POA: Diagnosis not present

## 2014-08-14 DIAGNOSIS — I10 Essential (primary) hypertension: Secondary | ICD-10-CM | POA: Diagnosis not present

## 2014-08-15 DIAGNOSIS — E785 Hyperlipidemia, unspecified: Secondary | ICD-10-CM | POA: Diagnosis not present

## 2014-08-15 DIAGNOSIS — E784 Other hyperlipidemia: Secondary | ICD-10-CM | POA: Diagnosis not present

## 2014-08-15 DIAGNOSIS — I10 Essential (primary) hypertension: Secondary | ICD-10-CM | POA: Diagnosis not present

## 2014-08-15 DIAGNOSIS — J449 Chronic obstructive pulmonary disease, unspecified: Secondary | ICD-10-CM | POA: Diagnosis not present

## 2014-08-16 DIAGNOSIS — E784 Other hyperlipidemia: Secondary | ICD-10-CM | POA: Diagnosis not present

## 2014-08-16 DIAGNOSIS — I10 Essential (primary) hypertension: Secondary | ICD-10-CM | POA: Diagnosis not present

## 2014-08-16 DIAGNOSIS — E785 Hyperlipidemia, unspecified: Secondary | ICD-10-CM | POA: Diagnosis not present

## 2014-08-16 DIAGNOSIS — J449 Chronic obstructive pulmonary disease, unspecified: Secondary | ICD-10-CM | POA: Diagnosis not present

## 2014-08-17 DIAGNOSIS — J449 Chronic obstructive pulmonary disease, unspecified: Secondary | ICD-10-CM | POA: Diagnosis not present

## 2014-08-17 DIAGNOSIS — E784 Other hyperlipidemia: Secondary | ICD-10-CM | POA: Diagnosis not present

## 2014-08-17 DIAGNOSIS — I10 Essential (primary) hypertension: Secondary | ICD-10-CM | POA: Diagnosis not present

## 2014-08-17 DIAGNOSIS — E785 Hyperlipidemia, unspecified: Secondary | ICD-10-CM | POA: Diagnosis not present

## 2014-08-18 DIAGNOSIS — E784 Other hyperlipidemia: Secondary | ICD-10-CM | POA: Diagnosis not present

## 2014-08-18 DIAGNOSIS — E785 Hyperlipidemia, unspecified: Secondary | ICD-10-CM | POA: Diagnosis not present

## 2014-08-18 DIAGNOSIS — I10 Essential (primary) hypertension: Secondary | ICD-10-CM | POA: Diagnosis not present

## 2014-08-18 DIAGNOSIS — J449 Chronic obstructive pulmonary disease, unspecified: Secondary | ICD-10-CM | POA: Diagnosis not present

## 2014-08-19 DIAGNOSIS — E784 Other hyperlipidemia: Secondary | ICD-10-CM | POA: Diagnosis not present

## 2014-08-19 DIAGNOSIS — E785 Hyperlipidemia, unspecified: Secondary | ICD-10-CM | POA: Diagnosis not present

## 2014-08-19 DIAGNOSIS — I10 Essential (primary) hypertension: Secondary | ICD-10-CM | POA: Diagnosis not present

## 2014-08-19 DIAGNOSIS — J449 Chronic obstructive pulmonary disease, unspecified: Secondary | ICD-10-CM | POA: Diagnosis not present

## 2014-08-20 DIAGNOSIS — E785 Hyperlipidemia, unspecified: Secondary | ICD-10-CM | POA: Diagnosis not present

## 2014-08-20 DIAGNOSIS — E784 Other hyperlipidemia: Secondary | ICD-10-CM | POA: Diagnosis not present

## 2014-08-20 DIAGNOSIS — I10 Essential (primary) hypertension: Secondary | ICD-10-CM | POA: Diagnosis not present

## 2014-08-20 DIAGNOSIS — J449 Chronic obstructive pulmonary disease, unspecified: Secondary | ICD-10-CM | POA: Diagnosis not present

## 2014-08-20 DIAGNOSIS — R32 Unspecified urinary incontinence: Secondary | ICD-10-CM | POA: Diagnosis not present

## 2014-08-21 DIAGNOSIS — I10 Essential (primary) hypertension: Secondary | ICD-10-CM | POA: Diagnosis not present

## 2014-08-21 DIAGNOSIS — E784 Other hyperlipidemia: Secondary | ICD-10-CM | POA: Diagnosis not present

## 2014-08-21 DIAGNOSIS — J449 Chronic obstructive pulmonary disease, unspecified: Secondary | ICD-10-CM | POA: Diagnosis not present

## 2014-08-21 DIAGNOSIS — E785 Hyperlipidemia, unspecified: Secondary | ICD-10-CM | POA: Diagnosis not present

## 2014-08-22 DIAGNOSIS — E785 Hyperlipidemia, unspecified: Secondary | ICD-10-CM | POA: Diagnosis not present

## 2014-08-22 DIAGNOSIS — J449 Chronic obstructive pulmonary disease, unspecified: Secondary | ICD-10-CM | POA: Diagnosis not present

## 2014-08-22 DIAGNOSIS — E784 Other hyperlipidemia: Secondary | ICD-10-CM | POA: Diagnosis not present

## 2014-08-22 DIAGNOSIS — I10 Essential (primary) hypertension: Secondary | ICD-10-CM | POA: Diagnosis not present

## 2014-08-23 DIAGNOSIS — E784 Other hyperlipidemia: Secondary | ICD-10-CM | POA: Diagnosis not present

## 2014-08-23 DIAGNOSIS — I10 Essential (primary) hypertension: Secondary | ICD-10-CM | POA: Diagnosis not present

## 2014-08-23 DIAGNOSIS — E785 Hyperlipidemia, unspecified: Secondary | ICD-10-CM | POA: Diagnosis not present

## 2014-08-23 DIAGNOSIS — J449 Chronic obstructive pulmonary disease, unspecified: Secondary | ICD-10-CM | POA: Diagnosis not present

## 2014-08-24 DIAGNOSIS — J449 Chronic obstructive pulmonary disease, unspecified: Secondary | ICD-10-CM | POA: Diagnosis not present

## 2014-08-24 DIAGNOSIS — I10 Essential (primary) hypertension: Secondary | ICD-10-CM | POA: Diagnosis not present

## 2014-08-24 DIAGNOSIS — E785 Hyperlipidemia, unspecified: Secondary | ICD-10-CM | POA: Diagnosis not present

## 2014-08-24 DIAGNOSIS — E784 Other hyperlipidemia: Secondary | ICD-10-CM | POA: Diagnosis not present

## 2014-08-25 DIAGNOSIS — E785 Hyperlipidemia, unspecified: Secondary | ICD-10-CM | POA: Diagnosis not present

## 2014-08-25 DIAGNOSIS — I10 Essential (primary) hypertension: Secondary | ICD-10-CM | POA: Diagnosis not present

## 2014-08-25 DIAGNOSIS — E784 Other hyperlipidemia: Secondary | ICD-10-CM | POA: Diagnosis not present

## 2014-08-25 DIAGNOSIS — J449 Chronic obstructive pulmonary disease, unspecified: Secondary | ICD-10-CM | POA: Diagnosis not present

## 2014-08-26 DIAGNOSIS — E785 Hyperlipidemia, unspecified: Secondary | ICD-10-CM | POA: Diagnosis not present

## 2014-08-26 DIAGNOSIS — I10 Essential (primary) hypertension: Secondary | ICD-10-CM | POA: Diagnosis not present

## 2014-08-26 DIAGNOSIS — E784 Other hyperlipidemia: Secondary | ICD-10-CM | POA: Diagnosis not present

## 2014-08-26 DIAGNOSIS — J449 Chronic obstructive pulmonary disease, unspecified: Secondary | ICD-10-CM | POA: Diagnosis not present

## 2014-08-27 DIAGNOSIS — E784 Other hyperlipidemia: Secondary | ICD-10-CM | POA: Diagnosis not present

## 2014-08-27 DIAGNOSIS — E785 Hyperlipidemia, unspecified: Secondary | ICD-10-CM | POA: Diagnosis not present

## 2014-08-27 DIAGNOSIS — I10 Essential (primary) hypertension: Secondary | ICD-10-CM | POA: Diagnosis not present

## 2014-08-27 DIAGNOSIS — J449 Chronic obstructive pulmonary disease, unspecified: Secondary | ICD-10-CM | POA: Diagnosis not present

## 2014-08-28 DIAGNOSIS — E784 Other hyperlipidemia: Secondary | ICD-10-CM | POA: Diagnosis not present

## 2014-08-28 DIAGNOSIS — J449 Chronic obstructive pulmonary disease, unspecified: Secondary | ICD-10-CM | POA: Diagnosis not present

## 2014-08-28 DIAGNOSIS — E785 Hyperlipidemia, unspecified: Secondary | ICD-10-CM | POA: Diagnosis not present

## 2014-08-28 DIAGNOSIS — I10 Essential (primary) hypertension: Secondary | ICD-10-CM | POA: Diagnosis not present

## 2014-08-29 DIAGNOSIS — E785 Hyperlipidemia, unspecified: Secondary | ICD-10-CM | POA: Diagnosis not present

## 2014-08-29 DIAGNOSIS — I10 Essential (primary) hypertension: Secondary | ICD-10-CM | POA: Diagnosis not present

## 2014-08-29 DIAGNOSIS — E784 Other hyperlipidemia: Secondary | ICD-10-CM | POA: Diagnosis not present

## 2014-08-29 DIAGNOSIS — J449 Chronic obstructive pulmonary disease, unspecified: Secondary | ICD-10-CM | POA: Diagnosis not present

## 2014-08-30 DIAGNOSIS — I10 Essential (primary) hypertension: Secondary | ICD-10-CM | POA: Diagnosis not present

## 2014-08-30 DIAGNOSIS — E784 Other hyperlipidemia: Secondary | ICD-10-CM | POA: Diagnosis not present

## 2014-08-30 DIAGNOSIS — E785 Hyperlipidemia, unspecified: Secondary | ICD-10-CM | POA: Diagnosis not present

## 2014-08-30 DIAGNOSIS — J449 Chronic obstructive pulmonary disease, unspecified: Secondary | ICD-10-CM | POA: Diagnosis not present

## 2014-08-31 DIAGNOSIS — E784 Other hyperlipidemia: Secondary | ICD-10-CM | POA: Diagnosis not present

## 2014-08-31 DIAGNOSIS — I10 Essential (primary) hypertension: Secondary | ICD-10-CM | POA: Diagnosis not present

## 2014-08-31 DIAGNOSIS — J449 Chronic obstructive pulmonary disease, unspecified: Secondary | ICD-10-CM | POA: Diagnosis not present

## 2014-08-31 DIAGNOSIS — E785 Hyperlipidemia, unspecified: Secondary | ICD-10-CM | POA: Diagnosis not present

## 2014-09-01 DIAGNOSIS — J449 Chronic obstructive pulmonary disease, unspecified: Secondary | ICD-10-CM | POA: Diagnosis not present

## 2014-09-01 DIAGNOSIS — I10 Essential (primary) hypertension: Secondary | ICD-10-CM | POA: Diagnosis not present

## 2014-09-01 DIAGNOSIS — E785 Hyperlipidemia, unspecified: Secondary | ICD-10-CM | POA: Diagnosis not present

## 2014-09-01 DIAGNOSIS — E784 Other hyperlipidemia: Secondary | ICD-10-CM | POA: Diagnosis not present

## 2014-09-02 DIAGNOSIS — E785 Hyperlipidemia, unspecified: Secondary | ICD-10-CM | POA: Diagnosis not present

## 2014-09-02 DIAGNOSIS — J449 Chronic obstructive pulmonary disease, unspecified: Secondary | ICD-10-CM | POA: Diagnosis not present

## 2014-09-02 DIAGNOSIS — I10 Essential (primary) hypertension: Secondary | ICD-10-CM | POA: Diagnosis not present

## 2014-09-02 DIAGNOSIS — E784 Other hyperlipidemia: Secondary | ICD-10-CM | POA: Diagnosis not present

## 2014-09-03 DIAGNOSIS — I10 Essential (primary) hypertension: Secondary | ICD-10-CM | POA: Diagnosis not present

## 2014-09-03 DIAGNOSIS — E784 Other hyperlipidemia: Secondary | ICD-10-CM | POA: Diagnosis not present

## 2014-09-03 DIAGNOSIS — J449 Chronic obstructive pulmonary disease, unspecified: Secondary | ICD-10-CM | POA: Diagnosis not present

## 2014-09-03 DIAGNOSIS — E785 Hyperlipidemia, unspecified: Secondary | ICD-10-CM | POA: Diagnosis not present

## 2014-09-04 DIAGNOSIS — I10 Essential (primary) hypertension: Secondary | ICD-10-CM | POA: Diagnosis not present

## 2014-09-04 DIAGNOSIS — J449 Chronic obstructive pulmonary disease, unspecified: Secondary | ICD-10-CM | POA: Diagnosis not present

## 2014-09-04 DIAGNOSIS — E785 Hyperlipidemia, unspecified: Secondary | ICD-10-CM | POA: Diagnosis not present

## 2014-09-04 DIAGNOSIS — E784 Other hyperlipidemia: Secondary | ICD-10-CM | POA: Diagnosis not present

## 2014-09-04 NOTE — Progress Notes (Signed)
Patient ID: Brandy Copeland, female   DOB: Feb 14, 1946, 69 y.o.   MRN: [id number] 69 y.o.  f/u for CAD Cath done end of 9/14 Rx with plavix and imdur Limited by severe COPD  Had moderate disease in IM/circumflex that would be hard to stent And mid RCA more approachable  Felt by Dr Martinique that initial medical Rx warranted and she has done well with ths  Indication: SEMI duirng COPD exacerbation Sept 2014   Coronary angiography:  Coronary dominance: right  Left mainstem: Calcified 30% mid  Left anterior descending (LAD): 40% proximal 60% mid and 70% distal  IM: Large vessel With bifurcation 70% tortuous disease in smaller superior branch  Left circumflex (LCx): 40% ostial 70% mid vessel disease at take off of two small OM brances 30% distal disease  Right coronary artery (RCA): 60% proximal, 60-70% mid 50% distal  PDA: 40% ostial  Left ventriculography: Left ventricular systolic function is normal, LVEF is estimated at 55-65%, there is no significant mitral regurgitation  Impression:  Mid RCA is tightest approachable lesion. The mid circumflex and superior branch of IM would be  Difficult to stent. May be that medical Rx is best to start since she has no chest pain and is severely limited by smoking and COPD   07/04/14 hospitalized with COPD exacerbation No chest pain but small bump in troponin .05  No chest pain not taking nitro   ROS: Denies fever, malais, weight loss, blurry vision, decreased visual acuity, cough, sputum, SOB, hemoptysis, pleuritic pain, palpitaitons, heartburn, abdominal pain, melena, lower extremity edema, claudication, or rash.  All other systems reviewed and negative  General: Affect appropriate Chronically ill black female with walker  HEENT: normal Neck supple with no adenopathy JVP normal no bruits no thyromegaly Lungs poor air movement  no wheezing and good diaphragmatic motion Heart:  S1/S2 3/6 SEM  murmur, no rub, gallop or click PMI normal Abdomen: benighn, BS  positve, no tenderness, no AAA no bruit.  No HSM or HJR Distal pulses intact with no bruits No edema Neuro non-focal Skin warm and dry No muscular weakness   Current Outpatient Prescriptions  Medication Sig Dispense Refill  . amLODipine (NORVASC) 10 MG tablet Take 1 tablet (10 mg total) by mouth daily. 30 tablet 1  . aspirin EC 81 MG tablet Take 1 tablet (81 mg total) by mouth daily. 30 tablet 11  . carvedilol (COREG) 12.5 MG tablet Take 1 tablet (12.5 mg total) by mouth 2 (two) times daily with a meal. 60 tablet 1  . clopidogrel (PLAVIX) 75 MG tablet 1 TABLET BY MOUTH EVERY MORNING WITH BREAKFAST 30 tablet 0  . dextromethorphan-guaiFENesin (MUCINEX DM) 30-600 MG per 12 hr tablet Take 1 tablet by mouth 2 (two) times daily as needed for cough.    . famciclovir (FAMVIR) 500 MG tablet Take 500 mg by mouth 3 (three) times daily.    [name] ipratropium-albuterol (DUONEB) 0.5-2.5 (3) MG/3ML SOLN Take 3 mLs by nebulization every 6 (six) hours as needed. 360 mL 2  . isosorbide mononitrate (ISMO,MONOKET) 20 MG tablet Take 1/2 tab by mouth every morning  99  . levofloxacin (LEVAQUIN) 500 MG tablet Take 1 tablet (500 mg total) by mouth daily. 4 tablet 0  . lovastatin (MEVACOR) 20 MG tablet Take 1 tablet (20 mg total) by mouth at bedtime. 30 tablet 2  . nabumetone (RELAFEN) 750 MG tablet Take 750 mg by mouth daily.  1  . NITROSTAT 0.4 MG SL tablet 1 TABLET UNDER TONGUE IF  NEEDED EVERY 5 MINUTES FOR CHEST FOR PAIN FOR 3 DOSES IF NO RELIEF AFTER 3RD DOSE CALL 911. 25 tablet 0  . oxybutynin (DITROPAN) 5 MG tablet Take 5 mg by mouth at bedtime.    . traMADol (ULTRAM) 50 MG tablet Take 50 mg by mouth every morning.     . valsartan (DIOVAN) 160 MG tablet Take 1 tablet (160 mg total) by mouth 2 (two) times daily. 2 daily 60 tablet 11   No current facility-administered medications for this visit.    Allergies  Hctz  Electrocardiogram:  11/14  SR rate 90 LAD   06/04/14 SR rate 65 normal   07/03/14  ST rate 100  otherwise normal   Assessment and Plan CAD: Stable with no angina and good activity level.  Continue medical Rx  COPD: no active wheezing cut back on cigarettes but has not quit  CXR 07/02/14 reviewed atelectasis no cancer  HTN: Will change how she takes meds  Currently not taking any in afternoon and BP high not  Take ismo and norvasc in middle of day  Chol:  Cholesterol is at goal.  Continue current dose of statin and diet Rx.  No myalgias or side effects.  F/U  LFT's in 6 months. Lab Results  Component Value Date   LDLCALC 84 03/22/2013  labs with primary   F/U with me in 6 months

## 2014-09-05 DIAGNOSIS — I10 Essential (primary) hypertension: Secondary | ICD-10-CM | POA: Diagnosis not present

## 2014-09-05 DIAGNOSIS — J449 Chronic obstructive pulmonary disease, unspecified: Secondary | ICD-10-CM | POA: Diagnosis not present

## 2014-09-05 DIAGNOSIS — E785 Hyperlipidemia, unspecified: Secondary | ICD-10-CM | POA: Diagnosis not present

## 2014-09-05 DIAGNOSIS — E784 Other hyperlipidemia: Secondary | ICD-10-CM | POA: Diagnosis not present

## 2014-09-06 DIAGNOSIS — E784 Other hyperlipidemia: Secondary | ICD-10-CM | POA: Diagnosis not present

## 2014-09-06 DIAGNOSIS — I10 Essential (primary) hypertension: Secondary | ICD-10-CM | POA: Diagnosis not present

## 2014-09-06 DIAGNOSIS — J449 Chronic obstructive pulmonary disease, unspecified: Secondary | ICD-10-CM | POA: Diagnosis not present

## 2014-09-06 DIAGNOSIS — E785 Hyperlipidemia, unspecified: Secondary | ICD-10-CM | POA: Diagnosis not present

## 2014-09-07 DIAGNOSIS — E784 Other hyperlipidemia: Secondary | ICD-10-CM | POA: Diagnosis not present

## 2014-09-07 DIAGNOSIS — J449 Chronic obstructive pulmonary disease, unspecified: Secondary | ICD-10-CM | POA: Diagnosis not present

## 2014-09-07 DIAGNOSIS — E785 Hyperlipidemia, unspecified: Secondary | ICD-10-CM | POA: Diagnosis not present

## 2014-09-07 DIAGNOSIS — I10 Essential (primary) hypertension: Secondary | ICD-10-CM | POA: Diagnosis not present

## 2014-09-08 DIAGNOSIS — J449 Chronic obstructive pulmonary disease, unspecified: Secondary | ICD-10-CM | POA: Diagnosis not present

## 2014-09-08 DIAGNOSIS — E785 Hyperlipidemia, unspecified: Secondary | ICD-10-CM | POA: Diagnosis not present

## 2014-09-08 DIAGNOSIS — E784 Other hyperlipidemia: Secondary | ICD-10-CM | POA: Diagnosis not present

## 2014-09-08 DIAGNOSIS — I10 Essential (primary) hypertension: Secondary | ICD-10-CM | POA: Diagnosis not present

## 2014-09-09 ENCOUNTER — Ambulatory Visit (INDEPENDENT_AMBULATORY_CARE_PROVIDER_SITE_OTHER): Payer: Medicare Other | Admitting: Cardiovascular Disease

## 2014-09-09 ENCOUNTER — Encounter: Payer: Self-pay | Admitting: Cardiovascular Disease

## 2014-09-09 VITALS — BP 210/90 | HR 68 | Ht 63.0 in | Wt 147.4 lb

## 2014-09-09 DIAGNOSIS — J449 Chronic obstructive pulmonary disease, unspecified: Secondary | ICD-10-CM | POA: Diagnosis not present

## 2014-09-09 DIAGNOSIS — I2510 Atherosclerotic heart disease of native coronary artery without angina pectoris: Secondary | ICD-10-CM | POA: Diagnosis not present

## 2014-09-09 DIAGNOSIS — I10 Essential (primary) hypertension: Secondary | ICD-10-CM | POA: Diagnosis not present

## 2014-09-09 DIAGNOSIS — E785 Hyperlipidemia, unspecified: Secondary | ICD-10-CM | POA: Diagnosis not present

## 2014-09-09 DIAGNOSIS — I2583 Coronary atherosclerosis due to lipid rich plaque: Principal | ICD-10-CM

## 2014-09-09 DIAGNOSIS — E784 Other hyperlipidemia: Secondary | ICD-10-CM | POA: Diagnosis not present

## 2014-09-09 NOTE — Patient Instructions (Addendum)
Your physician wants you to follow-up in: [place] will receive a reminder letter in the mail two months in advance. If you don't receive a letter, please call our office to schedule the follow-up appointment. Your physician recommends that you continue on your current medications as directed. Please refer to the Current Medication list given to you today. TAKE  VALSARTAN  AND  CARVEDILOL  IN AM   AND PM   TAKE  AMLODIPINE  AND ISOSORBIDE  IN THE  AFTERNOON

## 2014-09-10 DIAGNOSIS — I10 Essential (primary) hypertension: Secondary | ICD-10-CM | POA: Diagnosis not present

## 2014-09-10 DIAGNOSIS — E785 Hyperlipidemia, unspecified: Secondary | ICD-10-CM | POA: Diagnosis not present

## 2014-09-10 DIAGNOSIS — E784 Other hyperlipidemia: Secondary | ICD-10-CM | POA: Diagnosis not present

## 2014-09-10 DIAGNOSIS — J449 Chronic obstructive pulmonary disease, unspecified: Secondary | ICD-10-CM | POA: Diagnosis not present

## 2014-09-11 DIAGNOSIS — E785 Hyperlipidemia, unspecified: Secondary | ICD-10-CM | POA: Diagnosis not present

## 2014-09-11 DIAGNOSIS — E784 Other hyperlipidemia: Secondary | ICD-10-CM | POA: Diagnosis not present

## 2014-09-11 DIAGNOSIS — I10 Essential (primary) hypertension: Secondary | ICD-10-CM | POA: Diagnosis not present

## 2014-09-11 DIAGNOSIS — J449 Chronic obstructive pulmonary disease, unspecified: Secondary | ICD-10-CM | POA: Diagnosis not present

## 2014-09-12 DIAGNOSIS — I10 Essential (primary) hypertension: Secondary | ICD-10-CM | POA: Diagnosis not present

## 2014-09-12 DIAGNOSIS — J449 Chronic obstructive pulmonary disease, unspecified: Secondary | ICD-10-CM | POA: Diagnosis not present

## 2014-09-12 DIAGNOSIS — E785 Hyperlipidemia, unspecified: Secondary | ICD-10-CM | POA: Diagnosis not present

## 2014-09-12 DIAGNOSIS — E784 Other hyperlipidemia: Secondary | ICD-10-CM | POA: Diagnosis not present

## 2014-09-13 ENCOUNTER — Other Ambulatory Visit: Payer: Self-pay | Admitting: Adult Health

## 2014-09-13 ENCOUNTER — Other Ambulatory Visit: Payer: Self-pay | Admitting: Cardiovascular Disease

## 2014-09-13 DIAGNOSIS — E784 Other hyperlipidemia: Secondary | ICD-10-CM | POA: Diagnosis not present

## 2014-09-13 DIAGNOSIS — J449 Chronic obstructive pulmonary disease, unspecified: Secondary | ICD-10-CM | POA: Diagnosis not present

## 2014-09-13 DIAGNOSIS — I10 Essential (primary) hypertension: Secondary | ICD-10-CM | POA: Diagnosis not present

## 2014-09-13 DIAGNOSIS — E785 Hyperlipidemia, unspecified: Secondary | ICD-10-CM | POA: Diagnosis not present

## 2014-09-14 DIAGNOSIS — J449 Chronic obstructive pulmonary disease, unspecified: Secondary | ICD-10-CM | POA: Diagnosis not present

## 2014-09-14 DIAGNOSIS — E785 Hyperlipidemia, unspecified: Secondary | ICD-10-CM | POA: Diagnosis not present

## 2014-09-14 DIAGNOSIS — I10 Essential (primary) hypertension: Secondary | ICD-10-CM | POA: Diagnosis not present

## 2014-09-14 DIAGNOSIS — E784 Other hyperlipidemia: Secondary | ICD-10-CM | POA: Diagnosis not present

## 2014-09-15 DIAGNOSIS — I10 Essential (primary) hypertension: Secondary | ICD-10-CM | POA: Diagnosis not present

## 2014-09-15 DIAGNOSIS — E785 Hyperlipidemia, unspecified: Secondary | ICD-10-CM | POA: Diagnosis not present

## 2014-09-15 DIAGNOSIS — E784 Other hyperlipidemia: Secondary | ICD-10-CM | POA: Diagnosis not present

## 2014-09-15 DIAGNOSIS — J449 Chronic obstructive pulmonary disease, unspecified: Secondary | ICD-10-CM | POA: Diagnosis not present

## 2014-09-16 DIAGNOSIS — E785 Hyperlipidemia, unspecified: Secondary | ICD-10-CM | POA: Diagnosis not present

## 2014-09-16 DIAGNOSIS — J449 Chronic obstructive pulmonary disease, unspecified: Secondary | ICD-10-CM | POA: Diagnosis not present

## 2014-09-16 DIAGNOSIS — I10 Essential (primary) hypertension: Secondary | ICD-10-CM | POA: Diagnosis not present

## 2014-09-16 DIAGNOSIS — E784 Other hyperlipidemia: Secondary | ICD-10-CM | POA: Diagnosis not present

## 2014-09-17 DIAGNOSIS — J449 Chronic obstructive pulmonary disease, unspecified: Secondary | ICD-10-CM | POA: Diagnosis not present

## 2014-09-17 DIAGNOSIS — E785 Hyperlipidemia, unspecified: Secondary | ICD-10-CM | POA: Diagnosis not present

## 2014-09-17 DIAGNOSIS — I10 Essential (primary) hypertension: Secondary | ICD-10-CM | POA: Diagnosis not present

## 2014-09-17 DIAGNOSIS — E784 Other hyperlipidemia: Secondary | ICD-10-CM | POA: Diagnosis not present

## 2014-09-18 DIAGNOSIS — E785 Hyperlipidemia, unspecified: Secondary | ICD-10-CM | POA: Diagnosis not present

## 2014-09-18 DIAGNOSIS — E784 Other hyperlipidemia: Secondary | ICD-10-CM | POA: Diagnosis not present

## 2014-09-18 DIAGNOSIS — J449 Chronic obstructive pulmonary disease, unspecified: Secondary | ICD-10-CM | POA: Diagnosis not present

## 2014-09-18 DIAGNOSIS — I10 Essential (primary) hypertension: Secondary | ICD-10-CM | POA: Diagnosis not present

## 2014-09-19 DIAGNOSIS — I10 Essential (primary) hypertension: Secondary | ICD-10-CM | POA: Diagnosis not present

## 2014-09-19 DIAGNOSIS — E784 Other hyperlipidemia: Secondary | ICD-10-CM | POA: Diagnosis not present

## 2014-09-19 DIAGNOSIS — E785 Hyperlipidemia, unspecified: Secondary | ICD-10-CM | POA: Diagnosis not present

## 2014-09-19 DIAGNOSIS — J449 Chronic obstructive pulmonary disease, unspecified: Secondary | ICD-10-CM | POA: Diagnosis not present

## 2014-09-20 DIAGNOSIS — I10 Essential (primary) hypertension: Secondary | ICD-10-CM | POA: Diagnosis not present

## 2014-09-20 DIAGNOSIS — E784 Other hyperlipidemia: Secondary | ICD-10-CM | POA: Diagnosis not present

## 2014-09-20 DIAGNOSIS — R32 Unspecified urinary incontinence: Secondary | ICD-10-CM | POA: Diagnosis not present

## 2014-09-20 DIAGNOSIS — E785 Hyperlipidemia, unspecified: Secondary | ICD-10-CM | POA: Diagnosis not present

## 2014-09-20 DIAGNOSIS — J449 Chronic obstructive pulmonary disease, unspecified: Secondary | ICD-10-CM | POA: Diagnosis not present

## 2014-09-21 DIAGNOSIS — I10 Essential (primary) hypertension: Secondary | ICD-10-CM | POA: Diagnosis not present

## 2014-09-21 DIAGNOSIS — E784 Other hyperlipidemia: Secondary | ICD-10-CM | POA: Diagnosis not present

## 2014-09-21 DIAGNOSIS — J449 Chronic obstructive pulmonary disease, unspecified: Secondary | ICD-10-CM | POA: Diagnosis not present

## 2014-09-21 DIAGNOSIS — E785 Hyperlipidemia, unspecified: Secondary | ICD-10-CM | POA: Diagnosis not present

## 2014-09-22 DIAGNOSIS — J449 Chronic obstructive pulmonary disease, unspecified: Secondary | ICD-10-CM | POA: Diagnosis not present

## 2014-09-22 DIAGNOSIS — E785 Hyperlipidemia, unspecified: Secondary | ICD-10-CM | POA: Diagnosis not present

## 2014-09-22 DIAGNOSIS — I10 Essential (primary) hypertension: Secondary | ICD-10-CM | POA: Diagnosis not present

## 2014-09-22 DIAGNOSIS — E784 Other hyperlipidemia: Secondary | ICD-10-CM | POA: Diagnosis not present

## 2014-10-02 DIAGNOSIS — R32 Unspecified urinary incontinence: Secondary | ICD-10-CM | POA: Diagnosis not present

## 2014-10-20 ENCOUNTER — Telehealth: Payer: Self-pay | Admitting: Pulmonary Disease

## 2014-10-20 DIAGNOSIS — J449 Chronic obstructive pulmonary disease, unspecified: Secondary | ICD-10-CM | POA: Diagnosis not present

## 2014-10-20 DIAGNOSIS — R32 Unspecified urinary incontinence: Secondary | ICD-10-CM | POA: Diagnosis not present

## 2014-10-20 NOTE — Telephone Encounter (Signed)
Brandi (spelling?) from [employer]. EMS called. Answering service reported she was trying to find a physician to sign the patient's death certificate. I left a voice message with my name and our practice name requesting she call back for assistance either today or tomorrow during normal business hours. I left no patient info. In my message.

## 2014-11-04 DEATH — deceased

## 2015-03-12 ENCOUNTER — Encounter: Payer: Self-pay | Admitting: Cardiovascular Disease

## 2015-03-12 ENCOUNTER — Telehealth: Payer: Self-pay | Admitting: Cardiovascular Disease

## 2015-03-12 NOTE — Telephone Encounter (Signed)
New Message  This message is to inform you that we have made 3 consecutive attempts to contact the patient since 01/31/2015. We have also mailed a letter to the patient to inform them to call in and schedule. Although we were unsuccessful in these attempts we wanted you to be aware of our efforts. Will remove the patient from our recall list at this time  Jarold Motto [facility]

## 2015-03-14 NOTE — Telephone Encounter (Signed)
NOTED ./CY 

## 2015-08-05 IMAGING — CR DG CHEST 1V PORT
1 series · 1 of 1 positions shown · non-contrast
Comparison: [id number]

CLINICAL DATA: Respiratory distress

EXAM:
PORTABLE CHEST - 1 VIEW

[AP]
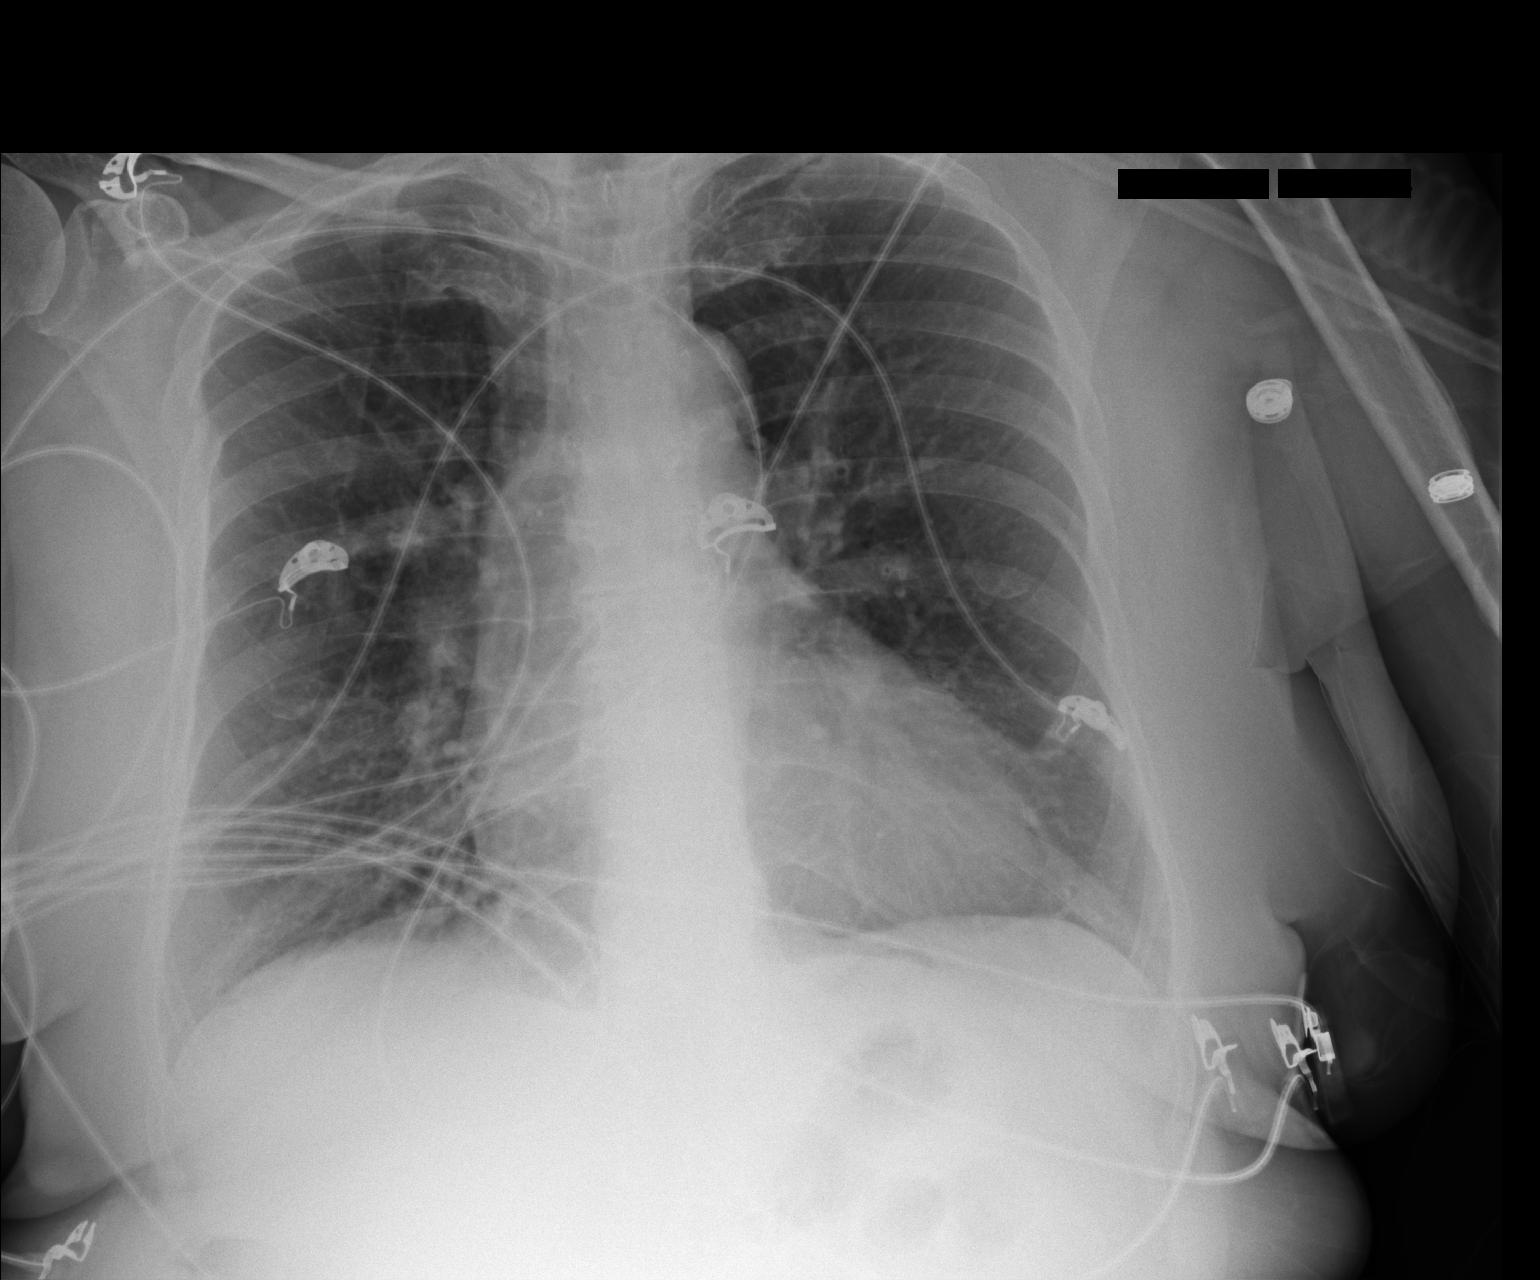

[1 of 1 positions shown; findings below may reference images not displayed]

FINDINGS: The heart size and mediastinal contours are within normal limits.
The lungs are hyperexpanded but clear. The visualized skeletal
structures are demineralized but otherwise unremarkable.
IMPRESSION: No acute cardiopulmonary disease

## 2016-11-20 IMAGING — CR DG CHEST 2V
2 series · 2 of 2 positions shown · non-contrast
Comparison: 12/29/2012.  03/08/2011.

CLINICAL DATA: COPD.  Hypertension .

EXAM:
CHEST  2 VIEW

[view not recorded (1 of 2)]
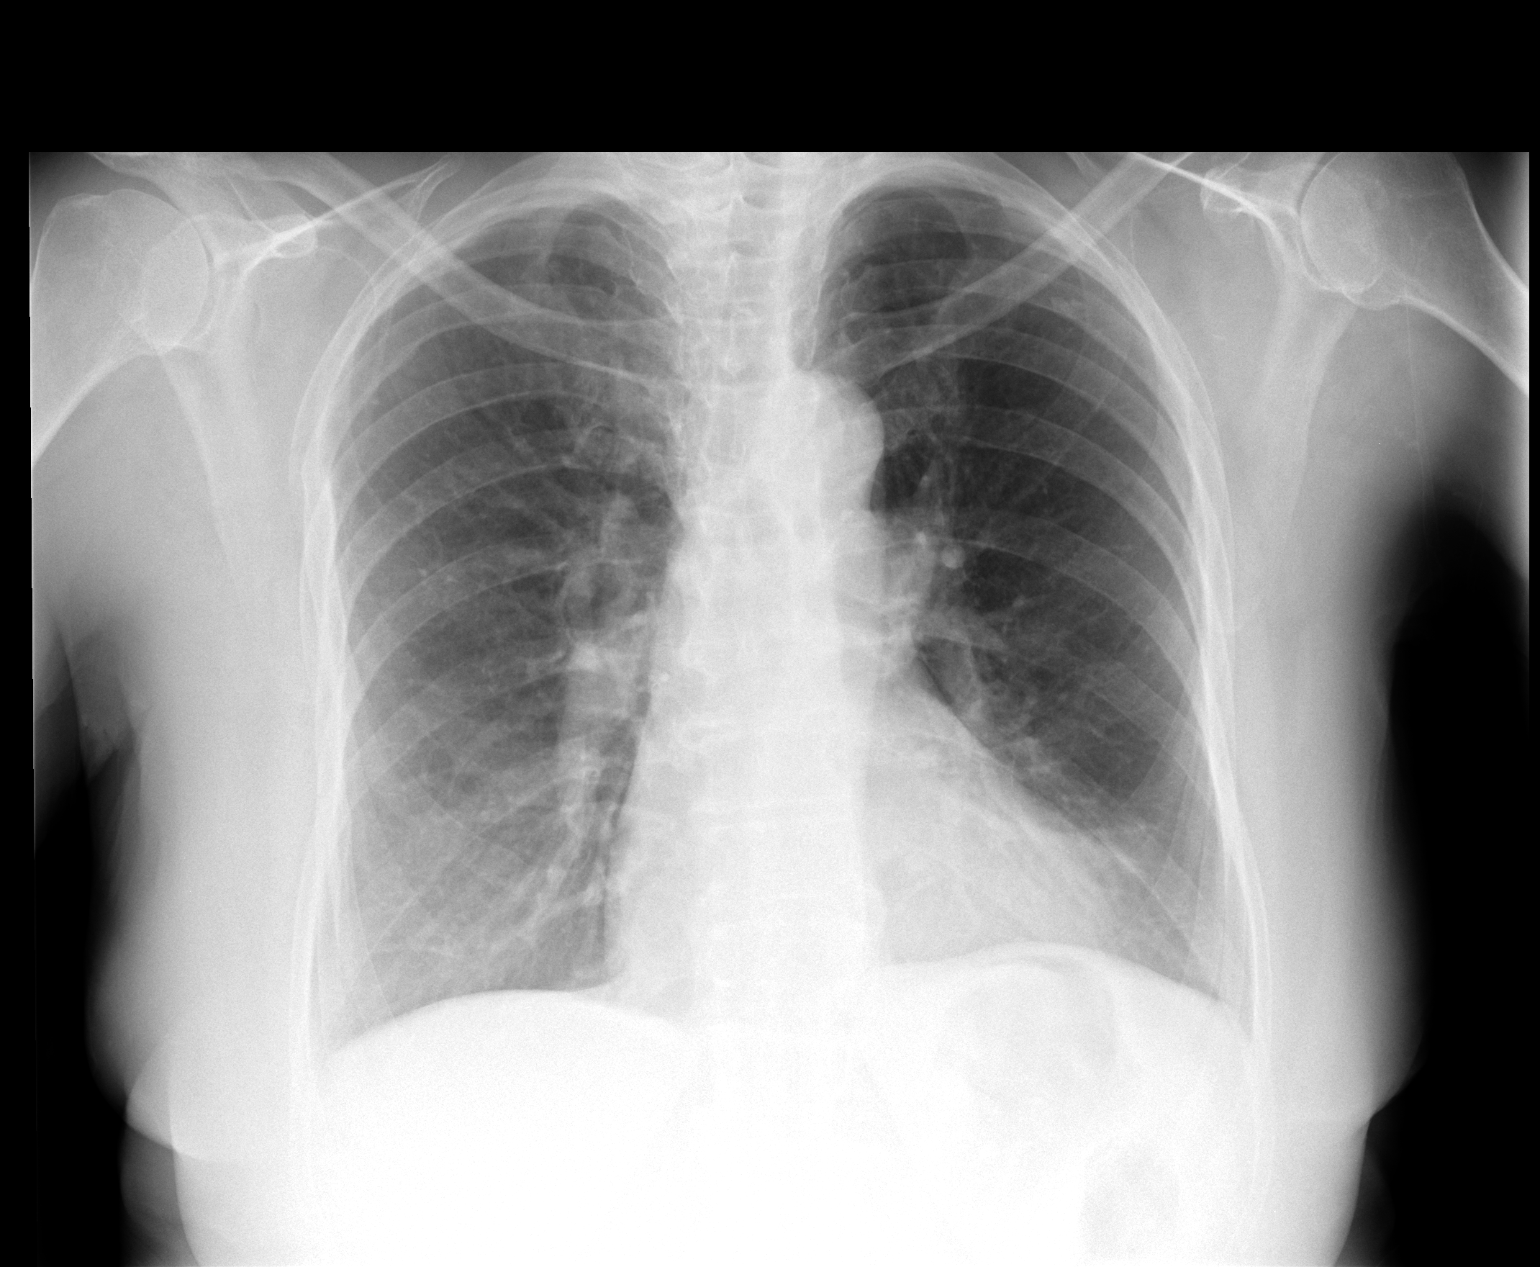

[view not recorded (2 of 2)]
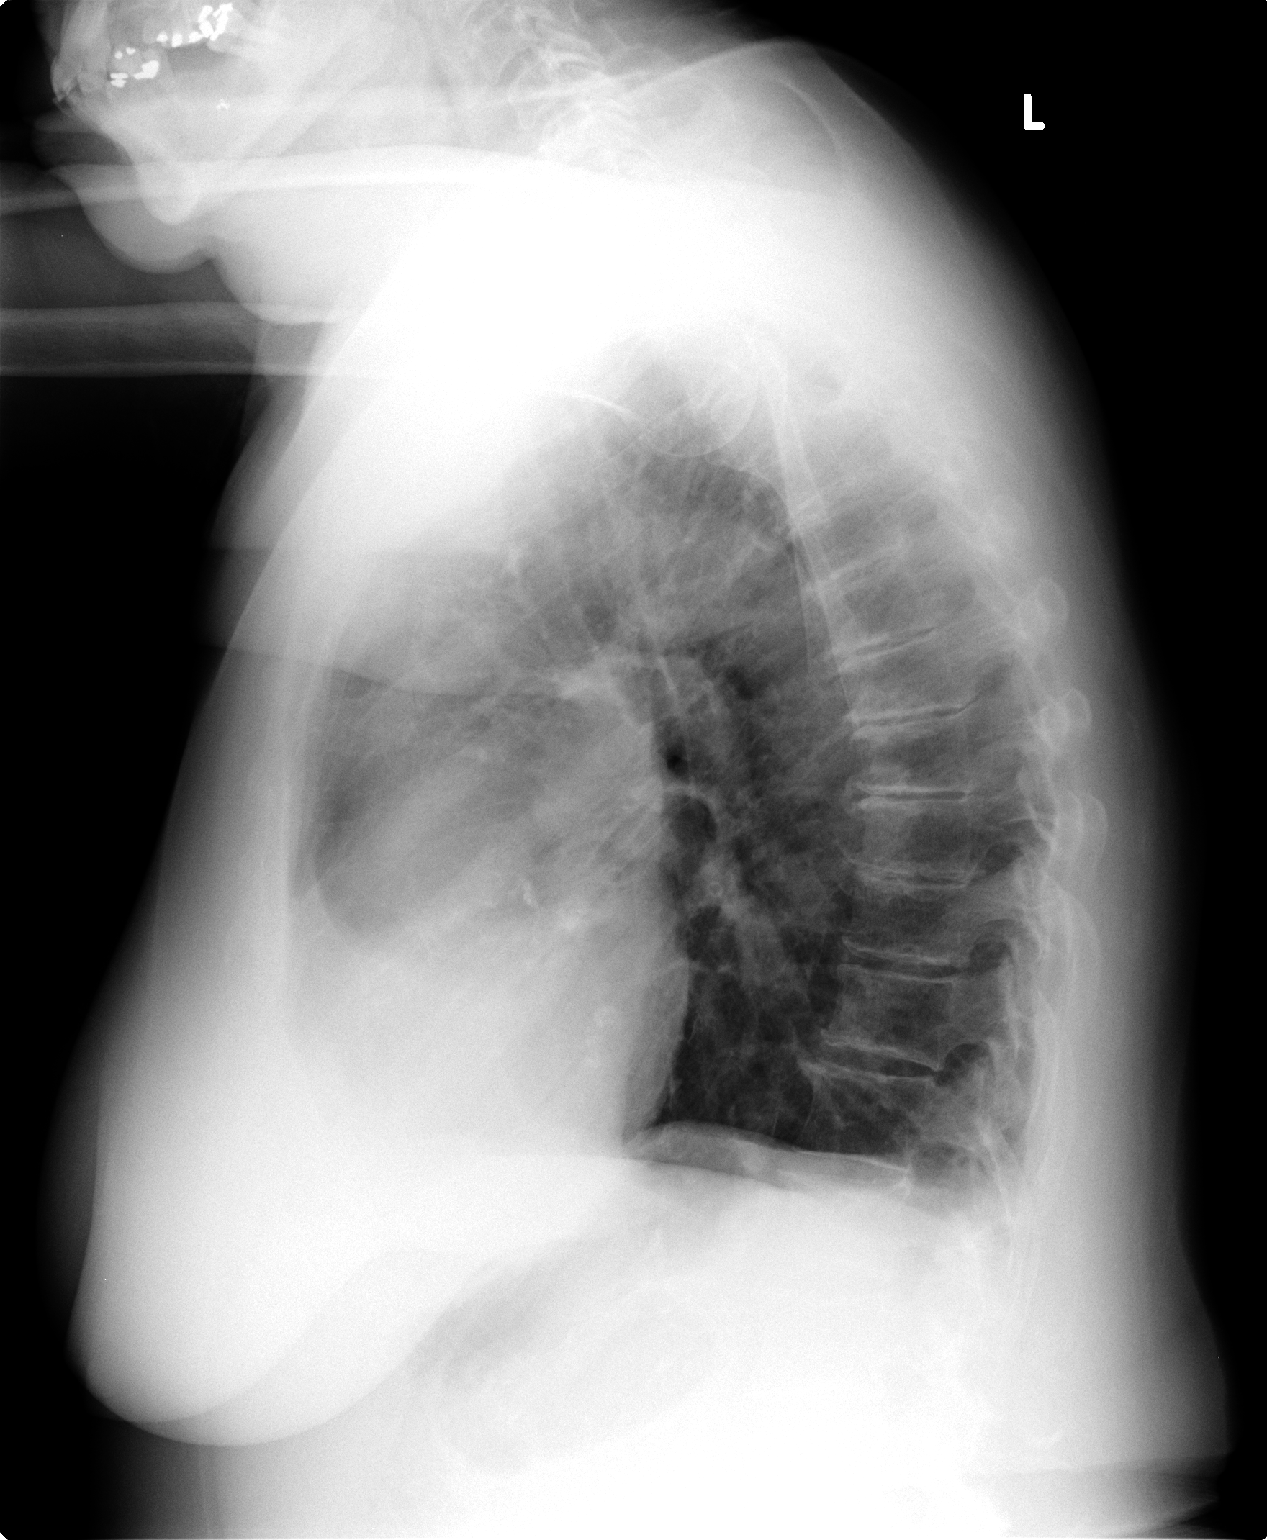

[2 of 2 positions shown; findings below may reference images not displayed]

FINDINGS: Mediastinum and hilar structures are normal. Pleural parenchymal
thickening again in the lingula consistent with scarring. Lungs are
otherwise clear. No pleural effusion or pneumothorax. Heart size
stable. No acute bony abnormality identified.
IMPRESSION: 1. Lingular pleural parenchymal thickening consistent with scarring.
2. No acute cardiopulmonary disease PA chest is stable from prior
exams.

## 2017-02-05 IMAGING — CR DG CHEST 1V PORT
1 series · 1 of 1 positions shown · non-contrast
Comparison: Chest radiograph performed 04/16/2014

CLINICAL DATA: Acute onset of shortness of breath. Initial
encounter.

EXAM:
PORTABLE CHEST - 1 VIEW

[AP]
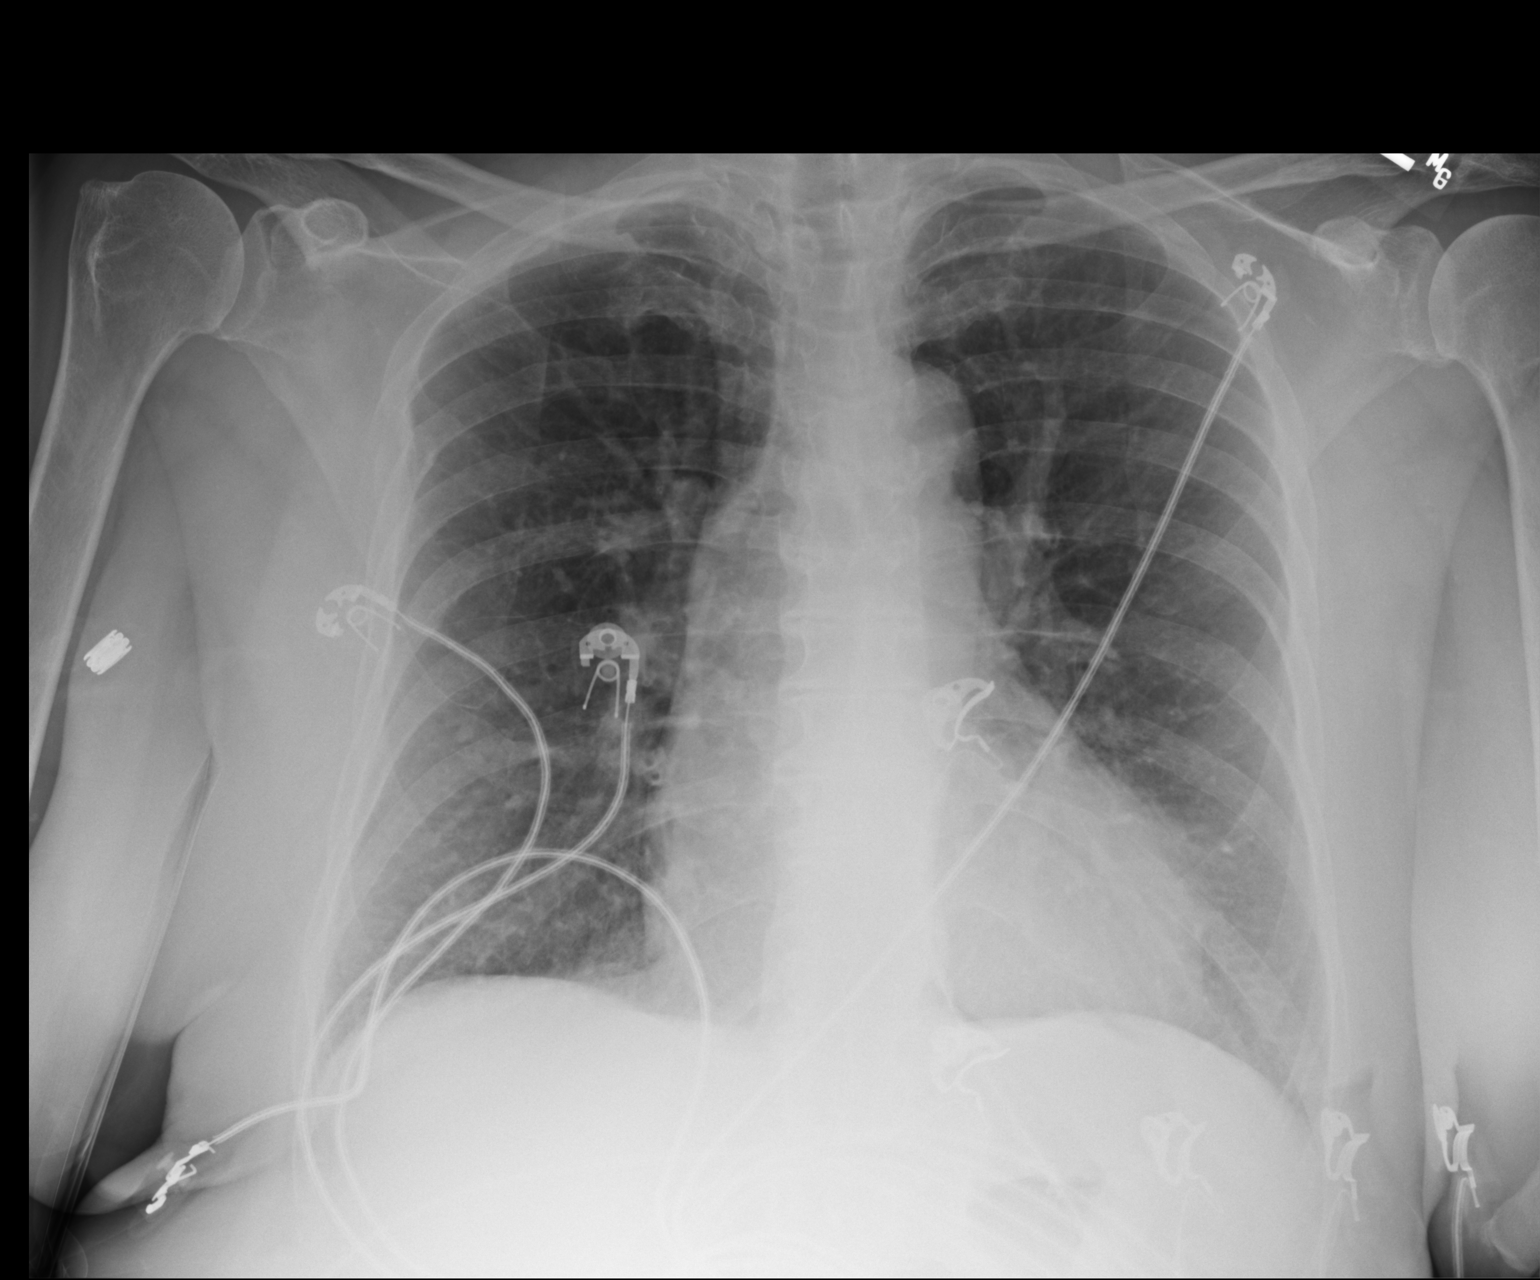

[1 of 1 positions shown; findings below may reference images not displayed]

FINDINGS: The lungs are well-aerated. Mild vascular congestion is noted.
Minimal bilateral atelectasis is seen. There is no evidence of
pleural effusion or pneumothorax.

The cardiomediastinal silhouette is borderline enlarged. No acute
osseous abnormalities are seen.
IMPRESSION: Mild vascular congestion and borderline cardiomegaly. Minimal
bilateral atelectasis seen.
